# Patient Record
Sex: Male | Born: 1960 | Race: White | Hispanic: No | Marital: Married | Smoking: Former smoker
Health system: Southern US, Community
[De-identification: ages and names within clinical notes are randomized; demographics above are authoritative.]

## PROBLEM LIST (undated history)

## (undated) DIAGNOSIS — M1990 Unspecified osteoarthritis, unspecified site: Secondary | ICD-10-CM

## (undated) DIAGNOSIS — G459 Transient cerebral ischemic attack, unspecified: Secondary | ICD-10-CM

## (undated) DIAGNOSIS — I10 Essential (primary) hypertension: Secondary | ICD-10-CM

## (undated) DIAGNOSIS — E7800 Pure hypercholesterolemia, unspecified: Secondary | ICD-10-CM

## (undated) DIAGNOSIS — C801 Malignant (primary) neoplasm, unspecified: Secondary | ICD-10-CM

## (undated) HISTORY — DX: Transient cerebral ischemic attack, unspecified: G45.9

## (undated) HISTORY — DX: Essential (primary) hypertension: I10

## (undated) HISTORY — DX: Unspecified osteoarthritis, unspecified site: M19.90

## (undated) HISTORY — PX: MELANOMA EXCISION: SHX5266

## (undated) HISTORY — DX: Malignant (primary) neoplasm, unspecified: C80.1

---

## 1998-04-08 HISTORY — PX: LUMBAR MICRODISCECTOMY: SHX99

## 2003-04-09 HISTORY — PX: CERVICAL FUSION: SHX112

## 2003-10-13 ENCOUNTER — Ambulatory Visit (HOSPITAL_COMMUNITY): Admission: RE | Admit: 2003-10-13 | Discharge: 2003-10-14 | Payer: Self-pay | Admitting: Neurological Surgery

## 2003-11-28 ENCOUNTER — Encounter: Admission: RE | Admit: 2003-11-28 | Discharge: 2003-11-28 | Payer: Self-pay | Admitting: Anesthesiology

## 2007-08-24 ENCOUNTER — Emergency Department: Payer: Self-pay | Admitting: Emergency Medicine

## 2007-09-01 ENCOUNTER — Ambulatory Visit: Payer: Self-pay | Admitting: Orthopedic Surgery

## 2009-05-01 ENCOUNTER — Ambulatory Visit: Payer: Self-pay | Admitting: Internal Medicine

## 2010-02-01 ENCOUNTER — Ambulatory Visit: Payer: Self-pay | Admitting: Internal Medicine

## 2011-09-11 ENCOUNTER — Ambulatory Visit: Payer: Self-pay | Admitting: Family Medicine

## 2012-04-08 HISTORY — PX: SHOULDER SURGERY: SHX246

## 2015-05-23 ENCOUNTER — Encounter: Payer: Self-pay | Admitting: Family Medicine

## 2015-05-23 ENCOUNTER — Ambulatory Visit (INDEPENDENT_AMBULATORY_CARE_PROVIDER_SITE_OTHER): Payer: 59 | Admitting: Family Medicine

## 2015-05-23 VITALS — BP 138/78 | HR 86 | Temp 98.2°F | Resp 18 | Ht 71.0 in | Wt 236.6 lb

## 2015-05-23 DIAGNOSIS — Z7689 Persons encountering health services in other specified circumstances: Secondary | ICD-10-CM

## 2015-05-23 DIAGNOSIS — Z7189 Other specified counseling: Secondary | ICD-10-CM | POA: Diagnosis not present

## 2015-05-23 NOTE — Progress Notes (Signed)
Name: Douglas Friedman   MRN: [id number]    DOB: [birth year]   Date:05/23/2015       Progress Note  Subjective  Chief Complaint  Chief Complaint  Patient presents with  . Establish Care    NP    HPI  Pt. Here to establish care. No previous PCP.  Past Medical History  Diagnosis Date  . Arthritis     Past Surgical History  Procedure Laterality Date  . Cervical fusion  2005  . Shoulder surgery Right 2014    Torn Supraspinatus  . Lumbar microdiscectomy Bilateral 2000    L4 and L5    Family History  Problem Relation Age of Onset  . Diabetes Mother   . Diabetes Brother     Social History   Social History  . Marital Status: Single    Spouse Name: N/A  . Number of Children: N/A  . Years of Education: N/A   Occupational History  . Not on file.   Social History Main Topics  . Smoking status: Never Smoker   . Smokeless tobacco: Never Used  . Alcohol Use: 3.6 oz/week    0 Standard drinks or equivalent, 6 Cans of beer per week     Comment: occasional,   . Drug Use: No  . Sexual Activity:    Partners: Female   Other Topics Concern  . Not on file   Social History Narrative  . No narrative on file    No current outpatient prescriptions on file.  No Known Allergies   ROS    Objective  Filed Vitals:   05/23/15 0952  BP: 138/78  Pulse: 86  Temp: 98.2 F (36.8 C)  TempSrc: Oral  Resp: 18  Height: 5\' 11"  (1.803 m)  Weight: 236 lb 9.6 oz (107.321 kg)  SpO2: 97%    Physical Exam  Constitutional: He is oriented to person, place, and time and well-developed, well-nourished, and in no distress.  Cardiovascular: Normal rate and regular rhythm.   Pulmonary/Chest: Effort normal and breath sounds normal.  Neurological: He is alert and oriented to person, place, and time.  Nursing note and vitals reviewed.    Assessment & Plan  1. Encounter to establish care with new doctor No previous PCP. Past medical, surgical, social and family history reviewed.  Will schedule for complete physical exam in 4 weeks.   Douglas Friedman [facility] 05/23/2015 10:14 AM

## 2015-06-20 ENCOUNTER — Encounter: Payer: Self-pay | Admitting: Family Medicine

## 2015-06-20 ENCOUNTER — Ambulatory Visit (INDEPENDENT_AMBULATORY_CARE_PROVIDER_SITE_OTHER): Payer: 59 | Admitting: Family Medicine

## 2015-06-20 VITALS — BP 134/80 | HR 83 | Temp 98.6°F | Resp 15 | Ht 71.0 in | Wt 237.3 lb

## 2015-06-20 DIAGNOSIS — Z1211 Encounter for screening for malignant neoplasm of colon: Secondary | ICD-10-CM | POA: Insufficient documentation

## 2015-06-20 DIAGNOSIS — Z0000 Encounter for general adult medical examination without abnormal findings: Secondary | ICD-10-CM | POA: Insufficient documentation

## 2015-06-20 NOTE — Progress Notes (Signed)
Name: Douglas Friedman   MRN: [id number]    DOB: Mar 18, 1961   Date:06/20/2015       Progress Note  Subjective  Chief Complaint  Chief Complaint  Patient presents with  . Annual Exam    CPE    HPI  Complete Physical Exam  Pt. Is here for a Complete Physical Exam. Never had colon or prostate cancer screening. Never had EKG or lipid screening. He is in good health otherwise.  Past Medical History  Diagnosis Date  . Arthritis     Past Surgical History  Procedure Laterality Date  . Cervical fusion  2005  . Shoulder surgery Right 2014    Torn Supraspinatus  . Lumbar microdiscectomy Bilateral 2000    L4 and L5    Family History  Problem Relation Age of Onset  . Diabetes Mother   . Diabetes Brother     Social History   Social History  . Marital Status: Single    Spouse Name: N/A  . Number of Children: N/A  . Years of Education: N/A   Occupational History  . Not on file.   Social History Main Topics  . Smoking status: Never Smoker   . Smokeless tobacco: Never Used  . Alcohol Use: 3.6 oz/week    0 Standard drinks or equivalent, 6 Cans of beer per week     Comment: occasional,   . Drug Use: No  . Sexual Activity:    Partners: Female   Other Topics Concern  . Not on file   Social History Narrative    No current outpatient prescriptions on file.  No Known Allergies   Review of Systems  Constitutional: Negative for fever, chills, weight loss and malaise/fatigue.  HENT: Negative for congestion and sore throat.   Eyes: Negative for blurred vision and double vision.  Respiratory: Negative for cough and shortness of breath.   Cardiovascular: Negative for chest pain.  Gastrointestinal: Negative for heartburn, nausea, vomiting, blood in stool and melena.  Genitourinary: Negative for dysuria, frequency and hematuria.  Musculoskeletal: Positive for back pain (back stays sore occasionally, had surgery years ago.). Negative for joint pain.  Neurological:  Negative for dizziness and headaches.  Psychiatric/Behavioral: Negative for depression. The patient is not nervous/anxious and does not have insomnia.       Objective  Filed Vitals:   06/20/15 0916  BP: 134/80  Pulse: 83  Temp: 98.6 F (37 C)  TempSrc: Oral  Resp: 15  Height: 5\' 11"  (1.803 m)  Weight: 237 lb 4.8 oz (107.639 kg)  SpO2: 96%    Physical Exam  Constitutional: He is oriented to person, place, and time and well-developed, well-nourished, and in no distress.  HENT:  Head: Normocephalic and atraumatic.  Eyes: Pupils are equal, round, and reactive to light.  Cardiovascular: Normal rate and regular rhythm.   No murmur heard. Pulmonary/Chest: Effort normal and breath sounds normal. He has no wheezes.  Abdominal: Soft. Bowel sounds are normal. There is no tenderness.  Genitourinary: Rectum normal and prostate normal. Prostate is not tender.  Peri-rectal skin tag  Musculoskeletal:       Right ankle: He exhibits no swelling.       Left ankle: He exhibits no swelling.  Neurological: He is alert and oriented to person, place, and time.  Skin: Skin is warm and dry.  Psychiatric: Memory, affect and judgment normal.  Nursing note and vitals reviewed.      Assessment & Plan  1. Annual physical exam  Obtain  age-appropriate screenings including lab work. EKG is normal sinus rhythm.  - Lipid Profile - Comprehensive Metabolic Panel (CMET) - TSH - Vitamin D (25 hydroxy) - PSA - EKG 12-Lead  2. Screening for colon cancer Overdue for colon cancer screening. - Ambulatory referral to Gastroenterology   Dossie Der [facility] 06/20/2015 9:43 AM

## 2015-06-21 LAB — LIPID PANEL
CHOL/HDL RATIO: 5 ratio (ref 0.0–5.0)
Cholesterol, Total: 203 mg/dL — ABNORMAL HIGH (ref 100–199)
HDL: 41 mg/dL (ref >39)
LDL CALC: 134 mg/dL — AB (ref 0–99)
Triglycerides: 138 mg/dL (ref 0–149)
VLDL CHOLESTEROL CAL: 28 mg/dL (ref 5–40)

## 2015-06-21 LAB — COMPREHENSIVE METABOLIC PANEL
ALK PHOS: 58 IU/L (ref 39–117)
ALT: 27 IU/L (ref 0–44)
AST: 16 IU/L (ref 0–40)
Albumin/Globulin Ratio: 1.6 (ref 1.2–2.2)
Albumin: 4.3 g/dL (ref 3.5–5.5)
BILIRUBIN TOTAL: 0.3 mg/dL (ref 0.0–1.2)
BUN / CREAT RATIO: 13 (ref 9–20)
BUN: 15 mg/dL (ref 6–24)
CALCIUM: 10.1 mg/dL (ref 8.7–10.2)
CHLORIDE: 103 mmol/L (ref 96–106)
CO2: 23 mmol/L (ref 18–29)
CREATININE: 1.15 mg/dL (ref 0.76–1.27)
GFR calc Af Amer: 83 mL/min/{1.73_m2} (ref >59)
GFR calc non Af Amer: 72 mL/min/{1.73_m2} (ref >59)
GLOBULIN, TOTAL: 2.7 g/dL (ref 1.5–4.5)
GLUCOSE: 101 mg/dL — AB (ref 65–99)
POTASSIUM: 4.8 mmol/L (ref 3.5–5.2)
SODIUM: 141 mmol/L (ref 134–144)
Total Protein: 7 g/dL (ref 6.0–8.5)

## 2015-06-21 LAB — TSH: TSH: 2.8 u[IU]/mL (ref 0.450–4.500)

## 2015-06-21 LAB — VITAMIN D 25 HYDROXY (VIT D DEFICIENCY, FRACTURES): VIT D 25 HYDROXY: 30.6 ng/mL (ref 30.0–100.0)

## 2015-06-21 LAB — PSA: Prostate Specific Ag, Serum: 0.8 ng/mL (ref 0.0–4.0)

## 2015-06-22 ENCOUNTER — Telehealth: Payer: Self-pay | Admitting: Gastroenterology

## 2015-06-22 NOTE — Telephone Encounter (Signed)
Patient was returning your phone call regarding scheduling a colonoscopy

## 2015-06-23 ENCOUNTER — Other Ambulatory Visit: Payer: Self-pay

## 2015-06-23 NOTE — Telephone Encounter (Signed)
Gastroenterology Pre-Procedure Review  Request Date: 07/10/15 Requesting Physician: Dr. Manuella Ghazi  PATIENT REVIEW QUESTIONS: The patient responded to the following health history questions as indicated:    1. Are you having any GI issues? no 2. Do you have a personal history of Polyps? no 3. Do you have a family history of Colon Cancer or Polyps? no 4. Diabetes Mellitus? no 5. Joint replacements in the past 12 months?no 6. Major health problems in the past 3 months?no 7. Any artificial heart valves, MVP, or defibrillator?no    MEDICATIONS & ALLERGIES:    Patient reports the following regarding taking any anticoagulation/antiplatelet therapy:   Plavix, Coumadin, Eliquis, Xarelto, Lovenox, Pradaxa, Brilinta, or Effient? no Aspirin? no  Patient confirms/reports the following medications:  No current outpatient prescriptions on file.   No current facility-administered medications for this visit.    Patient confirms/reports the following allergies:  No Known Allergies  No orders of the defined types were placed in this encounter.    AUTHORIZATION INFORMATION Primary Insurance: 1D#: Group #:  Secondary Insurance: 1D#: Group #:  SCHEDULE INFORMATION: Date: 07/10/15 Time: Location: [place]

## 2015-06-23 NOTE — Telephone Encounter (Signed)
Pt scheduled for screening colonoscopy at [facility] on 07/10/15. Instructs/rx mailed. Please precert.

## 2015-06-23 NOTE — Telephone Encounter (Signed)
Pt. Returned your call. 

## 2015-06-29 ENCOUNTER — Encounter: Payer: Self-pay | Admitting: Family Medicine

## 2015-06-29 ENCOUNTER — Ambulatory Visit (INDEPENDENT_AMBULATORY_CARE_PROVIDER_SITE_OTHER): Payer: 59 | Admitting: Family Medicine

## 2015-06-29 VITALS — BP 122/80 | HR 102 | Temp 97.8°F | Resp 18 | Ht 71.0 in | Wt 232.5 lb

## 2015-06-29 DIAGNOSIS — E785 Hyperlipidemia, unspecified: Secondary | ICD-10-CM | POA: Diagnosis not present

## 2015-06-29 MED ORDER — ROSUVASTATIN CALCIUM 5 MG PO TABS: 5.0000 mg | ORAL_TABLET | Freq: Every day | ORAL | Status: DC

## 2015-06-29 NOTE — Progress Notes (Signed)
Name: Douglas Friedman   MRN: [id number]    DOB: 1960/05/03   Date:06/29/2015       Progress Note  Subjective  Chief Complaint  Chief Complaint  Patient presents with  . review lab results    HPI  Hyperlipidemia: Elevated Total Cholesterol and LDL. 10-year risk of CVD is estimated at 10%. Pt. eats a balanced low-cholesterol diet. Is going to start walking again. No FHx of high cholesterol.  Past Medical History  Diagnosis Date  . Arthritis     Past Surgical History  Procedure Laterality Date  . Cervical fusion  2005  . Shoulder surgery Right 2014    Torn Supraspinatus  . Lumbar microdiscectomy Bilateral 2000    L4 and L5    Family History  Problem Relation Age of Onset  . Diabetes Mother   . Diabetes Brother     Social History   Social History  . Marital Status: Single    Spouse Name: N/A  . Number of Children: N/A  . Years of Education: N/A   Occupational History  . Not on file.   Social History Main Topics  . Smoking status: Never Smoker   . Smokeless tobacco: Never Used  . Alcohol Use: 3.6 oz/week    0 Standard drinks or equivalent, 6 Cans of beer per week     Comment: occasional,   . Drug Use: No  . Sexual Activity:    Partners: Female   Other Topics Concern  . Not on file   Social History Narrative     Current outpatient prescriptions:  .  cyclobenzaprine (FLEXERIL) 10 MG tablet, Take by mouth., Disp: , Rfl:  .  ibuprofen (ADVIL,MOTRIN) 600 MG tablet, Take by mouth., Disp: , Rfl:   No Known Allergies   ROS    Objective  Filed Vitals:   06/29/15 1349  BP: 122/80  Pulse: 102  Temp: 97.8 F (36.6 C)  Resp: 18  Height: 5\' 11"  (1.803 m)  Weight: 232 lb 8 oz (105.461 kg)  SpO2: 97%    Physical Exam  Constitutional: He is oriented to person, place, and time and well-developed, well-nourished, and in no distress.  Cardiovascular: Normal rate and regular rhythm.   Pulmonary/Chest: Effort normal and breath sounds normal.   Neurological: He is alert and oriented to person, place, and time.  Nursing note and vitals reviewed.      Assessment & Plan  1. Hyperlipidemia We will start on Crestor 5 mg at bedtime. Patient educated on potential adverse effects. Continue with dietary and lifestyle interventions. Recheck in 3 months - rosuvastatin (CRESTOR) 5 MG tablet; Take 1 tablet (5 mg total) by mouth at bedtime.  Dispense: 90 tablet; Refill: 0   Douglas Friedman [facility] 06/29/2015 2:41 PM

## 2015-07-03 ENCOUNTER — Ambulatory Visit: Payer: 59 | Admitting: Family Medicine

## 2015-07-05 ENCOUNTER — Encounter: Payer: Self-pay | Admitting: *Deleted

## 2015-07-06 NOTE — Discharge Instructions (Signed)

## 2015-07-10 ENCOUNTER — Ambulatory Visit
Admission: RE | Admit: 2015-07-10 | Discharge: 2015-07-10 | Disposition: A | Discharge status: Home / Self Care | Payer: 59 | Source: Ambulatory Visit | Attending: Gastroenterology | Admitting: Gastroenterology

## 2015-07-10 ENCOUNTER — Ambulatory Visit: Payer: 59 | Admitting: Anesthesiology

## 2015-07-10 ENCOUNTER — Encounter
Admission: RE | Disposition: A | Discharge status: Home / Self Care | Payer: Self-pay | Source: Ambulatory Visit | Attending: Gastroenterology

## 2015-07-10 DIAGNOSIS — Z1211 Encounter for screening for malignant neoplasm of colon: Secondary | ICD-10-CM | POA: Insufficient documentation

## 2015-07-10 DIAGNOSIS — Z87891 Personal history of nicotine dependence: Secondary | ICD-10-CM | POA: Insufficient documentation

## 2015-07-10 DIAGNOSIS — Z79899 Other long term (current) drug therapy: Secondary | ICD-10-CM | POA: Diagnosis not present

## 2015-07-10 DIAGNOSIS — K641 Second degree hemorrhoids: Secondary | ICD-10-CM | POA: Diagnosis not present

## 2015-07-10 DIAGNOSIS — D122 Benign neoplasm of ascending colon: Secondary | ICD-10-CM | POA: Diagnosis not present

## 2015-07-10 DIAGNOSIS — M1990 Unspecified osteoarthritis, unspecified site: Secondary | ICD-10-CM | POA: Insufficient documentation

## 2015-07-10 DIAGNOSIS — K5730 Diverticulosis of large intestine without perforation or abscess without bleeding: Secondary | ICD-10-CM | POA: Insufficient documentation

## 2015-07-10 DIAGNOSIS — E7800 Pure hypercholesterolemia, unspecified: Secondary | ICD-10-CM | POA: Insufficient documentation

## 2015-07-10 HISTORY — DX: Pure hypercholesterolemia, unspecified: E78.00

## 2015-07-10 HISTORY — PX: POLYPECTOMY: SHX5525

## 2015-07-10 HISTORY — PX: COLONOSCOPY WITH PROPOFOL: SHX5780

## 2015-07-10 SURGERY — COLONOSCOPY WITH PROPOFOL
Anesthesia: Monitor Anesthesia Care | Wound class: Contaminated

## 2015-07-10 MED ORDER — PROPOFOL 10 MG/ML IV BOLUS
INTRAVENOUS | Status: DC | PRN
  Administered 2015-07-10: 100 mg via INTRAVENOUS
  Administered 2015-07-10: 30 mg via INTRAVENOUS
  Administered 2015-07-10: 20 mg via INTRAVENOUS
  Administered 2015-07-10 (×2): 50 mg via INTRAVENOUS

## 2015-07-10 MED ORDER — LACTATED RINGERS IV SOLN
INTRAVENOUS | Status: DC
  Administered 2015-07-10 (×2): via INTRAVENOUS

## 2015-07-10 MED ORDER — LIDOCAINE HCL (CARDIAC) 20 MG/ML IV SOLN
INTRAVENOUS | Status: DC | PRN
  Administered 2015-07-10: 20 mg via INTRAVENOUS

## 2015-07-10 MED ORDER — STERILE WATER FOR IRRIGATION IR SOLN
Status: DC | PRN
  Administered 2015-07-10: 11:00:00

## 2015-07-10 SURGICAL SUPPLY — 21 items
CANISTER SUCT 1200ML W/VALVE (MISCELLANEOUS) ×4 IMPLANT
CLIP HMST 235XBRD CATH ROT (MISCELLANEOUS) IMPLANT
CLIP RESOLUTION 360 11X235 (MISCELLANEOUS)
FCP ESCP3.2XJMB 240X2.8X (MISCELLANEOUS)
FORCEPS BIOP RAD 4 LRG CAP 4 (CUTTING FORCEPS) ×2 IMPLANT
FORCEPS BIOP RJ4 240 W/NDL (MISCELLANEOUS)
FORCEPS ESCP3.2XJMB 240X2.8X (MISCELLANEOUS) IMPLANT
GOWN CVR UNV OPN BCK APRN NK (MISCELLANEOUS) ×4 IMPLANT
GOWN ISOL THUMB LOOP REG UNIV (MISCELLANEOUS) ×8
INJECTOR VARIJECT VIN23 (MISCELLANEOUS) IMPLANT
KIT DEFENDO VALVE AND CONN (KITS) IMPLANT
KIT ENDO PROCEDURE OLY (KITS) ×4 IMPLANT
MARKER SPOT ENDO TATTOO 5ML (MISCELLANEOUS) IMPLANT
PAD GROUND ADULT SPLIT (MISCELLANEOUS) IMPLANT
PROBE APC STR FIRE (PROBE) ×4 IMPLANT
SNARE SHORT THROW 13M SML OVAL (MISCELLANEOUS) IMPLANT
SNARE SHORT THROW 30M LRG OVAL (MISCELLANEOUS) IMPLANT
SPOT EX ENDOSCOPIC TATTOO (MISCELLANEOUS)
VARIJECT INJECTOR VIN23 (MISCELLANEOUS)
WATER STERILE IRR 250ML POUR (IV SOLUTION) ×4 IMPLANT
WIDE-EYE POLYPTRAP (MISCELLANEOUS) IMPLANT

## 2015-07-10 NOTE — H&P (Signed)
  [facility]  [address], [place], [place] [address] Phone: [phone] Fax : [phone]  Primary Care Physician:  Keith Rake, MD Primary Gastroenterologist:  Dr. Allen Norris  Pre-Procedure History & Physical: HPI:  Douglas Friedman is a 55 y.o. male is here for a screening colonoscopy.   Past Medical History  Diagnosis Date  . Arthritis   . Hypercholesteremia     Past Surgical History  Procedure Laterality Date  . Cervical fusion  2005    x2  . Shoulder surgery Right 2014    Torn Supraspinatus  . Lumbar microdiscectomy Bilateral 2000    L4 and L5    Prior to Admission medications   Medication Sig Start Date End Date Taking? Authorizing Provider  Cholecalciferol (VITAMIN D PO) Take by mouth daily.   Yes Historical Provider, MD  Multiple Vitamin (MULTIVITAMIN) capsule Take 1 capsule by mouth daily.   Yes Historical Provider, MD  rosuvastatin (CRESTOR) 5 MG tablet Take 1 tablet (5 mg total) by mouth at bedtime. 06/29/15  Yes Roselee Nova, MD    Allergies as of 06/23/2015  . (No Known Allergies)    Family History  Problem Relation Age of Onset  . Diabetes Mother   . Diabetes Brother     Social History   Social History  . Marital Status: Single    Spouse Name: N/A  . Number of Children: N/A  . Years of Education: N/A   Occupational History  . Not on file.   Social History Main Topics  . Smoking status: Former Research scientist (life sciences)  . Smokeless tobacco: Never Used     Comment: quit 1988  . Alcohol Use: 3.6 oz/week    6 Cans of beer, 0 Standard drinks or equivalent per week     Comment: occasional,   . Drug Use: No  . Sexual Activity:    Partners: Female   Other Topics Concern  . Not on file   Social History Narrative    Review of Systems: See HPI, otherwise negative ROS  Physical Exam: BP 128/99 mmHg  Pulse 74  Temp(Src) 98.6 F (37 C) (Tympanic)  Resp 16  Ht 5\' 11"  (1.803 m)  Wt 225 lb (102.059 kg)  BMI 31.39 kg/m2  SpO2  96% General:   Alert,  pleasant and cooperative in NAD Head:  Normocephalic and atraumatic. Neck:  Supple; no masses or thyromegaly. Lungs:  Clear throughout to auscultation.    Heart:  Regular rate and rhythm. Abdomen:  Soft, nontender and nondistended. Normal bowel sounds, without guarding, and without rebound.   Neurologic:  Alert and  oriented x4;  grossly normal neurologically.  Impression/Plan: Douglas Friedman is now here to undergo a screening colonoscopy.  Risks, benefits, and alternatives regarding colonoscopy have been reviewed with the patient.  Questions have been answered.  All parties agreeable.

## 2015-07-10 NOTE — Anesthesia Postprocedure Evaluation (Signed)
Anesthesia Post Note  Patient: Douglas Friedman  Procedure(s) Performed: Procedure(s) (LRB): COLONOSCOPY WITH PROPOFOL (N/A) POLYPECTOMY  Patient location during evaluation: PACU Anesthesia Type: MAC Level of consciousness: awake and alert Pain management: pain level controlled Vital Signs Assessment: post-procedure vital signs reviewed and stable Respiratory status: spontaneous breathing, nonlabored ventilation, respiratory function stable and patient connected to nasal cannula oxygen Cardiovascular status: blood pressure returned to baseline and stable Postop Assessment: no signs of nausea or vomiting Anesthetic complications: no    Alisa Graff

## 2015-07-10 NOTE — Anesthesia Preprocedure Evaluation (Signed)
Anesthesia Evaluation  Patient identified by MRN, date of birth, ID band Patient awake    Reviewed: Allergy & Precautions, H&P , NPO status , Patient's Chart, lab work & pertinent test results, reviewed documented beta blocker date and time   Airway Mallampati: II  TM Distance: >3 FB Neck ROM: full    Dental no notable dental hx.    Pulmonary neg pulmonary ROS, former smoker,    Pulmonary exam normal breath sounds clear to auscultation       Cardiovascular Exercise Tolerance: Good negative cardio ROS   Rhythm:regular Rate:Normal     Neuro/Psych negative neurological ROS  negative psych ROS   GI/Hepatic negative GI ROS, Neg liver ROS,   Endo/Other  negative endocrine ROS  Renal/GU negative Renal ROS  negative genitourinary   Musculoskeletal   Abdominal   Peds  Hematology negative hematology ROS (+)   Anesthesia Other Findings   Reproductive/Obstetrics negative OB ROS                             Anesthesia Physical Anesthesia Plan  ASA: II  Anesthesia Plan: MAC   Post-op Pain Management:    Induction:   Airway Management Planned:   Additional Equipment:   Intra-op Plan:   Post-operative Plan:   Informed Consent: I have reviewed the patients History and Physical, chart, labs and discussed the procedure including the risks, benefits and alternatives for the proposed anesthesia with the patient or authorized representative who has indicated his/her understanding and acceptance.   Dental Advisory Given  Plan Discussed with: CRNA  Anesthesia Plan Comments:         Anesthesia Quick Evaluation

## 2015-07-10 NOTE — Op Note (Signed)
[facility] Patient Name: Douglas Friedman Procedure Date: 07/10/2015 10:36 AM MRN: [id number] Account #: [id number] Date of Birth: September 15, 1960 Admit Type: Outpatient Age: 55 Room: [facility] OR ROOM 01 Gender: Male Note Status: Finalized Procedure:            Colonoscopy Indications:          Screening for colorectal malignant neoplasm Providers:            Lucilla Lame, MD Referring MD:         Otila Back. Manuella Ghazi (Referring MD) Medicines:            Propofol per Anesthesia Complications:        No immediate complications. Procedure:            Pre-Anesthesia Assessment:                       - Prior to the procedure, a History and Physical was                        performed, and patient medications and allergies were                        reviewed. The patient's tolerance of previous                        anesthesia was also reviewed. The risks and benefits of                        the procedure and the sedation options and risks were                        discussed with the patient. All questions were                        answered, and informed consent was obtained. Prior                        Anticoagulants: The patient has taken no previous                        anticoagulant or antiplatelet agents. ASA Grade                        Assessment: II - A patient with mild systemic disease.                        After reviewing the risks and benefits, the patient was                        deemed in satisfactory condition to undergo the                        procedure.                       After obtaining informed consent, the colonoscope was                        passed under direct vision. Throughout the procedure,  the patient's blood pressure, pulse, and oxygen                        saturations were monitored continuously. The was                        introduced through the anus and advanced to the the      cecum, identified by appendiceal orifice and ileocecal                        valve. The colonoscopy was performed without                        difficulty. The patient tolerated the procedure well.                        The quality of the bowel preparation was excellent. Findings:      The perianal and digital rectal examinations were normal.      A 5 mm polyp was found in the ascending colon. The polyp was sessile.       The polyp was removed with a cold biopsy forceps. Resection and       retrieval were complete.      Non-bleeding internal hemorrhoids were found during retroflexion. The       hemorrhoids were Grade II (internal hemorrhoids that prolapse but reduce       spontaneously).      Multiple small-mouthed diverticula were found in the sigmoid colon. Impression:           - One 5 mm polyp in the ascending colon, removed with a                        cold biopsy forceps. Resected and retrieved.                       - Non-bleeding internal hemorrhoids.                       - Diverticulosis in the sigmoid colon. Recommendation:       - Await pathology results.                       - Repeat colonoscopy in 5 years if polyp adenoma and 10                        years if hyperplastic Procedure Code(s):    --- Professional ---                       [id number], Colonoscopy, flexible; with biopsy, single or                        multiple Diagnosis Code(s):    --- Professional ---                       Z12.11, Encounter for screening for malignant neoplasm                        of colon  D12.2, Benign neoplasm of ascending colon CPT copyright 2016 American Medical Association. All rights reserved. The codes documented in this report are preliminary and upon coder review may  be revised to meet current compliance requirements. Lucilla Lame, MD 07/10/2015 11:01:57 AM This report has been signed electronically. Number of Addenda: 0 Note Initiated On: 07/10/2015  10:36 AM Scope Withdrawal Time: 0 hours 6 minutes 18 seconds  Total Procedure Duration: 0 hours 8 minutes 9 seconds       [facility]

## 2015-07-10 NOTE — Anesthesia Procedure Notes (Signed)
Procedure Name: MAC Performed by: Alis Sawchuk Pre-anesthesia Checklist: Patient identified, Emergency Drugs available, Suction available, Patient being monitored and Timeout performed Patient Re-evaluated:Patient Re-evaluated prior to inductionOxygen Delivery Method: Nasal cannula       

## 2015-07-10 NOTE — Transfer of Care (Signed)
Immediate Anesthesia Transfer of Care Note  Patient: Douglas Friedman  Procedure(s) Performed: Procedure(s): COLONOSCOPY WITH PROPOFOL (N/A) POLYPECTOMY  Patient Location: PACU  Anesthesia Type: MAC  Level of Consciousness: awake, alert  and patient cooperative  Airway and Oxygen Therapy: Patient Spontanous Breathing and Patient connected to supplemental oxygen  Post-op Assessment: Post-op Vital signs reviewed, Patient's Cardiovascular Status Stable, Respiratory Function Stable, Patent Airway and No signs of Nausea or vomiting  Post-op Vital Signs: Reviewed and stable  Complications: No apparent anesthesia complications

## 2015-07-11 ENCOUNTER — Encounter: Payer: Self-pay | Admitting: Gastroenterology

## 2015-07-12 ENCOUNTER — Encounter: Payer: Self-pay | Admitting: Gastroenterology

## 2015-09-29 ENCOUNTER — Ambulatory Visit: Payer: 59 | Admitting: Family Medicine

## 2015-10-16 ENCOUNTER — Encounter: Payer: Self-pay | Admitting: Family Medicine

## 2015-10-16 ENCOUNTER — Ambulatory Visit (INDEPENDENT_AMBULATORY_CARE_PROVIDER_SITE_OTHER): Payer: 59 | Admitting: Family Medicine

## 2015-10-16 VITALS — BP 125/81 | HR 79 | Temp 98.4°F | Resp 15 | Ht 71.0 in | Wt 232.9 lb

## 2015-10-16 DIAGNOSIS — R739 Hyperglycemia, unspecified: Secondary | ICD-10-CM | POA: Insufficient documentation

## 2015-10-16 DIAGNOSIS — E785 Hyperlipidemia, unspecified: Secondary | ICD-10-CM | POA: Diagnosis not present

## 2015-10-16 LAB — COMPREHENSIVE METABOLIC PANEL
ALBUMIN: 4.2 g/dL (ref 3.6–5.1)
ALT: 24 U/L (ref 9–46)
AST: 20 U/L (ref 10–35)
Alkaline Phosphatase: 52 U/L (ref 40–115)
BILIRUBIN TOTAL: 0.4 mg/dL (ref 0.2–1.2)
BUN: 15 mg/dL (ref 7–25)
CHLORIDE: 106 mmol/L (ref 98–110)
CO2: 24 mmol/L (ref 20–31)
CREATININE: 1.02 mg/dL (ref 0.70–1.33)
Calcium: 9.4 mg/dL (ref 8.6–10.3)
GLUCOSE: 99 mg/dL (ref 65–99)
Potassium: 4.6 mmol/L (ref 3.5–5.3)
SODIUM: 139 mmol/L (ref 135–146)
Total Protein: 6.9 g/dL (ref 6.1–8.1)

## 2015-10-16 LAB — LIPID PANEL
CHOL/HDL RATIO: 4 ratio (ref <=5.0)
Cholesterol: 175 mg/dL (ref 125–200)
HDL: 44 mg/dL (ref >=40)
LDL CALC: 108 mg/dL (ref <130)
Triglycerides: 113 mg/dL (ref <150)
VLDL: 23 mg/dL (ref <30)

## 2015-10-16 LAB — POCT GLYCOSYLATED HEMOGLOBIN (HGB A1C): Hemoglobin A1C: 5.7

## 2015-10-16 NOTE — Progress Notes (Signed)
Name: Douglas Friedman   MRN: [id number]    DOB: [birth year]   Date:10/16/2015       Progress Note  Subjective  Chief Complaint  Chief Complaint  Patient presents with  . Follow-up    3 mo    Hyperlipidemia This is a new problem. The current episode started more than 1 month ago. The problem is uncontrolled. Recent lipid tests were reviewed and are high. Pertinent negatives include no myalgias or shortness of breath. Current antihyperlipidemic treatment includes statins. Compliance problems: SOmetimes forgets to take it.  Risk factors for coronary artery disease include dyslipidemia.     Past Medical History  Diagnosis Date  . Arthritis   . Hypercholesteremia     Past Surgical History  Procedure Laterality Date  . Cervical fusion  2005    x2  . Shoulder surgery Right 2014    Torn Supraspinatus  . Lumbar microdiscectomy Bilateral 2000    L4 and L5  . Colonoscopy with propofol N/A 07/10/2015    Procedure: COLONOSCOPY WITH PROPOFOL;  Surgeon: Lucilla Lame, MD;  Location: [place];  Service: Endoscopy;  Laterality: N/A;  . Polypectomy  07/10/2015    Procedure: POLYPECTOMY;  Surgeon: Lucilla Lame, MD;  Location: [place];  Service: Endoscopy;;    Family History  Problem Relation Age of Onset  . Diabetes Mother   . Diabetes Brother     Social History   Social History  . Marital Status: Single    Spouse Name: N/A  . Number of Children: N/A  . Years of Education: N/A   Occupational History  . Not on file.   Social History Main Topics  . Smoking status: Former Research scientist (life sciences)  . Smokeless tobacco: Never Used     Comment: quit 1988  . Alcohol Use: 3.6 oz/week    6 Cans of beer, 0 Standard drinks or equivalent per week     Comment: occasional,   . Drug Use: No  . Sexual Activity:    Partners: Female   Other Topics Concern  . Not on file   Social History Narrative     Current outpatient prescriptions:  .  Cholecalciferol (VITAMIN D PO), Take by mouth  daily., Disp: , Rfl:  .  Multiple Vitamin (MULTIVITAMIN) capsule, Take 1 capsule by mouth daily., Disp: , Rfl:  .  rosuvastatin (CRESTOR) 5 MG tablet, Take 1 tablet (5 mg total) by mouth at bedtime., Disp: 90 tablet, Rfl: 0  No Known Allergies   Review of Systems  Respiratory: Negative for shortness of breath.   Musculoskeletal: Negative for myalgias.    Objective  Filed Vitals:   10/16/15 0928  BP: 125/81  Pulse: 79  Temp: 98.4 F (36.9 C)  TempSrc: Oral  Resp: 15  Height: 5\' 11"  (1.803 m)  Weight: 232 lb 14.4 oz (105.643 kg)  SpO2: 95%    Physical Exam  Constitutional: He is oriented to person, place, and time and well-developed, well-nourished, and in no distress.  Cardiovascular: Normal rate, regular rhythm and normal heart sounds.   No murmur heard. Pulmonary/Chest: Effort normal and breath sounds normal.  Abdominal: Soft. Bowel sounds are normal. There is no tenderness.  Neurological: He is alert and oriented to person, place, and time.  Skin: Skin is warm and dry.  Psychiatric: Mood, memory, affect and judgment normal.  Nursing note and vitals reviewed.    Assessment & Plan  1. Hyperlipidemia Has not taken statin regularly, repeat lipids and CMP today, encourage medication compliance.  Recheck in 6 months - Lipid Profile - Comprehensive Metabolic Panel (CMET)  2. Hyperglycemia  - POCT HgB A1C   Simuel Stebner [facility] 10/16/2015 9:31 AM

## 2015-10-18 ENCOUNTER — Telehealth: Payer: Self-pay | Admitting: Emergency Medicine

## 2015-10-18 NOTE — Telephone Encounter (Signed)
patient notified of labs

## 2015-10-24 ENCOUNTER — Other Ambulatory Visit: Payer: Self-pay | Admitting: Emergency Medicine

## 2015-10-24 DIAGNOSIS — E785 Hyperlipidemia, unspecified: Secondary | ICD-10-CM

## 2015-10-24 MED ORDER — ROSUVASTATIN CALCIUM 5 MG PO TABS: 5.0000 mg | ORAL_TABLET | Freq: Every day | ORAL | Status: DC

## 2016-04-17 ENCOUNTER — Ambulatory Visit: Payer: 59 | Admitting: Family Medicine

## 2018-02-24 ENCOUNTER — Ambulatory Visit
Admission: EM | Admit: 2018-02-24 | Discharge: 2018-02-24 | Disposition: A | Discharge status: Home / Self Care | Payer: 59 | Attending: Family Medicine | Admitting: Family Medicine

## 2018-02-24 ENCOUNTER — Other Ambulatory Visit: Payer: Self-pay

## 2018-02-24 ENCOUNTER — Encounter: Payer: Self-pay | Admitting: Emergency Medicine

## 2018-02-24 DIAGNOSIS — J0100 Acute maxillary sinusitis, unspecified: Secondary | ICD-10-CM

## 2018-02-24 MED ORDER — HYDROCOD POLST-CPM POLST ER 10-8 MG/5ML PO SUER: 5.0000 mL | Freq: Every evening | ORAL | 0 refills | Status: DC | PRN

## 2018-02-24 MED ORDER — AMOXICILLIN-POT CLAVULANATE 875-125 MG PO TABS: 1.0000 | ORAL_TABLET | Freq: Two times a day (BID) | ORAL | 0 refills | Status: DC

## 2018-02-24 NOTE — Discharge Instructions (Signed)
Medications as prescribed. ° °Take care ° °Dr. Michaelia Beilfuss  °

## 2018-02-24 NOTE — ED Triage Notes (Signed)
Patient c/o cough, congestion, watery eyes, and sinus pressure since Saturday.  Patient denies fevers.

## 2018-02-24 NOTE — ED Provider Notes (Signed)
[facility]  CSN: [id number] Arrival date & time: 02/24/18  1638  History   Chief Complaint Chief Complaint  Patient presents with  . Sinus Problem  . Cough   HPI 57 year old male presents with the above complaints.  Patient reports that he has been sick since Saturday.  Reports cough, sinus pressure and congestion, watery eyes.  There are multiple sick contacts.  His most troublesome symptom is a sinus pressure and pain as well as the cough.  Also reports postnasal drip.  No fever.  No chills.  He has been taking Alka-Seltzer cold and sinus without resolution.  No relieving factors.  No other complaints.   PMH, Surgical Hx, Family Hx, Social History reviewed and updated as below.  Past Medical History:  Diagnosis Date  . Arthritis   . Hypercholesteremia    Patient Active Problem List   Diagnosis Date Noted  . Hyperglycemia 10/16/2015  . Special screening for malignant neoplasms, colon   . Benign neoplasm of ascending colon   . Hyperlipidemia 06/29/2015  . Annual physical exam 06/20/2015  . Screening for colon cancer 06/20/2015  . Encounter to establish care with new doctor 05/23/2015   Past Surgical History:  Procedure Laterality Date  . CERVICAL FUSION  2005   x2  . COLONOSCOPY WITH PROPOFOL N/A 07/10/2015   Procedure: COLONOSCOPY WITH PROPOFOL;  Surgeon: Lucilla Lame, MD;  Location: [place];  Service: Endoscopy;  Laterality: N/A;  . LUMBAR MICRODISCECTOMY Bilateral 2000   L4 and L5  . POLYPECTOMY  07/10/2015   Procedure: POLYPECTOMY;  Surgeon: Lucilla Lame, MD;  Location: [place];  Service: Endoscopy;;  . SHOULDER SURGERY Right 2014   Torn Supraspinatus   Family History Family History  Problem Relation Age of Onset  . Diabetes Mother   . Diabetes Brother    Social History Social History   Tobacco Use  . Smoking status: Former Research scientist (life sciences)  . Smokeless tobacco: Never Used  . Tobacco comment: quit 1988  Substance Use Topics  .  Alcohol use: Yes    Alcohol/week: 6.0 standard drinks    Types: 6 Cans of beer per week    Comment: occasional,   . Drug use: No   Allergies   Patient has no known allergies.  Review of Systems Review of Systems  Constitutional: Negative for fever.  HENT: Positive for congestion, postnasal drip, sinus pressure and sinus pain.   Respiratory: Positive for cough.    Physical Exam Triage Vital Signs ED Triage Vitals  Enc Vitals Group     BP 02/24/18 0856 (!) 155/102     Pulse Rate 02/24/18 0856 70     Resp 02/24/18 0856 16     Temp 02/24/18 0856 97.8 F (36.6 C)     Temp Source 02/24/18 0856 Oral     SpO2 02/24/18 0856 96 %     Weight 02/24/18 0854 220 lb (99.8 kg)     Height 02/24/18 0854 5\' 11"  (1.803 m)     Head Circumference --      Peak Flow --      Pain Score 02/24/18 0853 5     Pain Loc --      Pain Edu? --      Excl. in [place]? --    Updated Vital Signs BP (!) 155/102 (BP Location: Left Arm)   Pulse 70   Temp 97.8 F (36.6 C) (Oral)   Resp 16   Ht 5\' 11"  (1.803 m)  Wt 99.8 kg   SpO2 96%   BMI 30.68 kg/m   Visual Acuity Right Eye Distance:   Left Eye Distance:   Bilateral Distance:    Right Eye Near:   Left Eye Near:    Bilateral Near:     Physical Exam  Constitutional: He is oriented to person, place, and time. He appears well-developed. No distress.  HENT:  Head: Normocephalic and atraumatic.  Mouth/Throat: Oropharynx is clear and moist.  Maxillary sinus tenderness to palpation.  Cardiovascular: Normal rate and regular rhythm.  Pulmonary/Chest: Effort normal and breath sounds normal. He has no wheezes. He has no rales.  Neurological: He is alert and oriented to person, place, and time.  Psychiatric: He has a normal mood and affect. His behavior is normal.  Nursing note and vitals reviewed.  UC Treatments / Results  Labs (all labs ordered are listed, but only abnormal results are displayed) Labs Reviewed - No data to  display  EKG None  Radiology No results found.  Procedures Procedures (including critical care time)  Medications Ordered in UC Medications - No data to display  Initial Impression / Assessment and Plan / UC Course  I have reviewed the triage vital signs and the nursing notes.  Pertinent labs & imaging results that were available during my care of the patient were reviewed by me and considered in my medical decision making (see chart for details).    57 year old male presents with sinusitis.  Treating with Augmentin and Tussionex.  Final Clinical Impressions(s) / UC Diagnoses   Final diagnoses:  Acute maxillary sinusitis, recurrence not specified     Discharge Instructions     Medications as prescribed.  Take care  Dr. Lacinda Axon    ED Prescriptions    Medication Sig Dispense Auth. Provider   amoxicillin-clavulanate (AUGMENTIN) 875-125 MG tablet Take 1 tablet by mouth every 12 (twelve) hours. 14 tablet [place], Butterfield G, DO   chlorpheniramine-HYDROcodone (TUSSIONEX PENNKINETIC ER) 10-8 MG/5ML SUER Take 5 mLs by mouth at bedtime as needed. 60 mL Coral Spikes, DO     Controlled Substance Prescriptions [place] Controlled Substance Registry consulted? Not Applicable   Coral Spikes, DO 02/24/18 1004

## 2018-08-18 ENCOUNTER — Encounter: Payer: Self-pay | Admitting: Emergency Medicine

## 2018-08-18 ENCOUNTER — Emergency Department
Admission: EM | Admit: 2018-08-18 | Discharge: 2018-08-18 | Disposition: A | Discharge status: Home / Self Care | Payer: 59 | Attending: Emergency Medicine | Admitting: Emergency Medicine

## 2018-08-18 ENCOUNTER — Other Ambulatory Visit: Payer: Self-pay

## 2018-08-18 DIAGNOSIS — Z87891 Personal history of nicotine dependence: Secondary | ICD-10-CM | POA: Insufficient documentation

## 2018-08-18 DIAGNOSIS — Y658 Other specified misadventures during surgical and medical care: Secondary | ICD-10-CM | POA: Diagnosis not present

## 2018-08-18 DIAGNOSIS — T8131XA Disruption of external operation (surgical) wound, not elsewhere classified, initial encounter: Secondary | ICD-10-CM | POA: Insufficient documentation

## 2018-08-18 DIAGNOSIS — Z4889 Encounter for other specified surgical aftercare: Secondary | ICD-10-CM

## 2018-08-18 LAB — CBC WITH DIFFERENTIAL/PLATELET
Abs Immature Granulocytes: 0.01 10*3/uL (ref 0.00–0.07)
Basophils Absolute: 0.1 10*3/uL (ref 0.0–0.1)
Basophils Relative: 1 %
Eosinophils Absolute: 0.2 10*3/uL (ref 0.0–0.5)
Eosinophils Relative: 2 %
HCT: 53.4 % — ABNORMAL HIGH (ref 39.0–52.0)
Hemoglobin: 18.1 g/dL — ABNORMAL HIGH (ref 13.0–17.0)
Immature Granulocytes: 0 %
Lymphocytes Relative: 16 %
Lymphs Abs: 1.6 10*3/uL (ref 0.7–4.0)
MCH: 31.1 pg (ref 26.0–34.0)
MCHC: 33.9 g/dL (ref 30.0–36.0)
MCV: 91.8 fL (ref 80.0–100.0)
Monocytes Absolute: 0.9 10*3/uL (ref 0.1–1.0)
Monocytes Relative: 10 %
Neutro Abs: 6.9 10*3/uL (ref 1.7–7.7)
Neutrophils Relative %: 71 %
Platelets: 233 10*3/uL (ref 150–400)
RBC: 5.82 MIL/uL — ABNORMAL HIGH (ref 4.22–5.81)
RDW: 13 % (ref 11.5–15.5)
WBC: 9.7 10*3/uL (ref 4.0–10.5)
nRBC: 0 % (ref 0.0–0.2)

## 2018-08-18 LAB — COMPREHENSIVE METABOLIC PANEL
ALT: 36 U/L (ref 0–44)
AST: 29 U/L (ref 15–41)
Albumin: 4.4 g/dL (ref 3.5–5.0)
Alkaline Phosphatase: 48 U/L (ref 38–126)
Anion gap: 10 (ref 5–15)
BUN: 17 mg/dL (ref 6–20)
CO2: 24 mmol/L (ref 22–32)
Calcium: 10.4 mg/dL — ABNORMAL HIGH (ref 8.9–10.3)
Chloride: 106 mmol/L (ref 98–111)
Creatinine, Ser: 1.06 mg/dL (ref 0.61–1.24)
GFR calc Af Amer: >60 mL/min (ref >60)
GFR calc non Af Amer: >60 mL/min (ref >60)
Glucose, Bld: 112 mg/dL — ABNORMAL HIGH (ref 70–99)
Potassium: 4 mmol/L (ref 3.5–5.1)
Sodium: 140 mmol/L (ref 135–145)
Total Bilirubin: 0.4 mg/dL (ref 0.3–1.2)
Total Protein: 8.1 g/dL (ref 6.5–8.1)

## 2018-08-18 LAB — PROTIME-INR
INR: 0.9 (ref 0.8–1.2)
Prothrombin Time: 12.4 seconds (ref 11.4–15.2)

## 2018-08-18 MED ORDER — TRANEXAMIC ACID-NACL 1000-0.7 MG/100ML-% IV SOLN
1000.0000 mg | INTRAVENOUS | Status: AC
  Administered 2018-08-18: 1000 mg via INTRAVENOUS
  Filled 2018-08-18: qty 100

## 2018-08-18 MED ORDER — SODIUM CHLORIDE 0.9 % IV BOLUS
1000.0000 mL | Freq: Once | INTRAVENOUS | Status: AC
  Administered 2018-08-18: 21:00:00 1000 mL via INTRAVENOUS

## 2018-08-18 NOTE — ED Provider Notes (Signed)
[facility] Emergency Department Provider Note  ____________________________________________  Time seen: Approximately 8:55 PM  I have reviewed the triage vital signs and the nursing notes.   HISTORY  Chief Complaint Wound Check    HPI Douglas Friedman is a 58 y.o. male who presents the emergency department complaining of bleeding to a dermatological incision site.  Patient reports that he had a lesion on his back that was evaluated by dermatology.  Patient reports that it was completely removed by his dermatologist and was negative for malignancy.  Patient reports that this evening he was sitting on the couch when he felt a "pop" and started to bleed from the incision site.  Patient was concerned that he had popped through the sutures.  Patient does not visualize the area since it started to bleed.  Dermatology procedure occurred approximately 4 hours prior to onset of bleeding.  Patient denies any bleeding or clotting disorders.  He denies any anticoagulation.  Patient denies any pain to the site.         Past Medical History:  Diagnosis Date  . Arthritis   . Hypercholesteremia     Patient Active Problem List   Diagnosis Date Noted  . Hyperglycemia 10/16/2015  . Special screening for malignant neoplasms, colon   . Benign neoplasm of ascending colon   . Hyperlipidemia 06/29/2015  . Annual physical exam 06/20/2015  . Screening for colon cancer 06/20/2015  . Encounter to establish care with new doctor 05/23/2015    Past Surgical History:  Procedure Laterality Date  . CERVICAL FUSION  2005   x2  . COLONOSCOPY WITH PROPOFOL N/A 07/10/2015   Procedure: COLONOSCOPY WITH PROPOFOL;  Surgeon: Lucilla Lame, MD;  Location: [place];  Service: Endoscopy;  Laterality: N/A;  . LUMBAR MICRODISCECTOMY Bilateral 2000   L4 and L5  . POLYPECTOMY  07/10/2015   Procedure: POLYPECTOMY;  Surgeon: Lucilla Lame, MD;  Location: [place];  Service:  Endoscopy;;  . SHOULDER SURGERY Right 2014   Torn Supraspinatus    Prior to Admission medications   Medication Sig Start Date End Date Taking? Authorizing Provider  amoxicillin-clavulanate (AUGMENTIN) 875-125 MG tablet Take 1 tablet by mouth every 12 (twelve) hours. 02/24/18   Coral Spikes, DO  chlorpheniramine-HYDROcodone (TUSSIONEX PENNKINETIC ER) 10-8 MG/5ML SUER Take 5 mLs by mouth at bedtime as needed. 02/24/18   Coral Spikes, DO  Cholecalciferol (VITAMIN D PO) Take by mouth daily.    [provider]  Multiple Vitamin (MULTIVITAMIN) capsule Take 1 capsule by mouth daily.    [provider]    Allergies Patient has no known allergies.  Family History  Problem Relation Age of Onset  . Diabetes Mother   . Diabetes Brother     Social History Social History   Tobacco Use  . Smoking status: Former Research scientist (life sciences)  . Smokeless tobacco: Never Used  . Tobacco comment: quit 1988  Substance Use Topics  . Alcohol use: Yes    Alcohol/week: 6.0 standard drinks    Types: 6 Cans of beer per week    Comment: occasional,   . Drug use: No     Review of Systems  Constitutional: No fever/chills Eyes: No visual changes.  Cardiovascular: no chest pain. Respiratory: no cough. No SOB. Gastrointestinal: No abdominal pain.  No nausea, no vomiting.   Musculoskeletal: Negative for musculoskeletal pain. Skin: Positive for bleeding surgical site. Neurological: Negative for headaches, focal weakness or numbness. 10-point ROS otherwise negative.  ____________________________________________  PHYSICAL EXAM:  VITAL SIGNS: ED Triage Vitals [08/18/18 1940]  Enc Vitals Group     BP (!) 180/100     Pulse Rate 88     Resp 18     Temp 98.2 F (36.8 C)     Temp Source Oral     SpO2 99 %     Weight 230 lb (104.3 kg)     Height 5\' 11"  (1.803 m)     Head Circumference      Peak Flow      Pain Score 0     Pain Loc      Pain Edu?      Excl. in [place]?      Constitutional:  Alert and oriented. Well appearing and in no acute distress. Eyes: Conjunctivae are normal. PERRL. EOMI. Head: Atraumatic. Neck: No stridor.    Cardiovascular: Normal rate, regular rhythm. Normal S1 and S2.  Good peripheral circulation. Respiratory: Normal respiratory effort without tachypnea or retractions. Lungs CTAB. Good air entry to the bases with no decreased or absent breath sounds. Musculoskeletal: Full range of motion to all extremities. No gross deformities appreciated. Neurologic:  Normal speech and language. No gross focal neurologic deficits are appreciated.  Skin:  Skin is warm, dry and intact. No rash noted.  Visualization of patient's back reveals a closed surgical incision site.  Sutures are intact.  There is slow but moderate to significant bleeding throughout the incision site.  Surrounding ecchymosis and edema concerning for underlying hematoma.  Area is nontender to palpation.  No dehiscence of the wound with palpation Psychiatric: Mood and affect are normal. Speech and behavior are normal. Patient exhibits appropriate insight and judgement.   ____________________________________________   LABS (all labs ordered are listed, but only abnormal results are displayed)  Labs Reviewed  COMPREHENSIVE METABOLIC PANEL - Abnormal; Notable for the following components:      Result Value   Glucose, Bld 112 (*)    Calcium 10.4 (*)    All other components within normal limits  CBC WITH DIFFERENTIAL/PLATELET - Abnormal; Notable for the following components:   RBC 5.82 (*)    Hemoglobin 18.1 (*)    HCT 53.4 (*)    All other components within normal limits  PROTIME-INR   ____________________________________________  EKG   ____________________________________________  RADIOLOGY   No results found.  ____________________________________________    PROCEDURES  Procedure(s) performed:    Marland KitchenMarland KitchenLaceration Repair Date/Time: 08/18/2018 10:54 PM Performed by: Darletta Moll, PA-C Authorized by: Darletta Moll, PA-C   Consent:    Consent obtained:  Verbal   Consent given by:  Patient   Risks discussed:  Pain Anesthesia (see MAR for exact dosages):    Anesthesia method:  Local infiltration   Local anesthetic:  Lidocaine 1% w/o epi Laceration details:    Location:  Trunk   Trunk location:  Upper back   Length (cm):  8 Exploration:    Hemostasis achieved with:  Epinephrine (and TXA (IV administration)) Post-procedure details:    Dressing:  Bulky dressing   Patient tolerance of procedure:  Tolerated well, no immediate complications Comments:     Bleeding surgical site identified.  External sutures are all intact with no dehiscence of the wound.  Given amount of ecchymosis, edema and bleeding from wound, I suspect underlying hematoma with bleedthrough.  Patient was given IV TXA, local infiltration with lidocaine with epi.  This causes good cessation of bleeding.  With pressure over hematoma, small amounts of blood are  expressed between edges of wound, however there is no active bleeding.      Medications  sodium chloride 0.9 % bolus 1,000 mL (1,000 mLs Intravenous New Bag/Given 08/18/18 2106)  tranexamic acid (CYKLOKAPRON) IVPB 1,000 mg (0 mg Intravenous Stopped 08/18/18 2135)     ____________________________________________   INITIAL IMPRESSION / ASSESSMENT AND PLAN / ED COURSE  Pertinent labs & imaging results that were available during my care of the patient were reviewed by me and considered in my medical decision making (see chart for details).  Review of the [place] CSRS was performed in accordance of the [place] prior to dispensing any controlled drugs.  Clinical Course as of Aug 18 2255  Tue Aug 18, 2018  2117 Patient presented to the emergency department with a complaint of bleeding surgical site to the back.  Patient had a dermatology procedure with a skin lesion removed to the posterior thorax.  External sutures are all intact but  patient does have moderate to significant bleeding through the incision site.  Patient does have a sizable hematoma underlying surgical incision.  At this time, I discussed medication options with pharmacy.  At this time I will try a intravenous TXA.  I am hesitant to begin injecting the area with lidocaine with epi as I do not have exact location of bleeding.  However if TXA does not slow bleeding, I will inject area with lidocaine with epi.  If this is also not successful, I will open the surgical site, to locate bleeder to see if I can cauterize or ligate with absorbable suture any bleeder.  Patient will also have labs to ensure no thrombocytopenia, or prolonged PT/INR.   [JC]    Clinical Course User Index [JC] Arden Tinoco, Charline Bills, PA-C          Patient's diagnosis is consistent with encounter for postoperative wound check.  Patient presented to the emergency department with bleeding wound to the back.  Patient reports that he had a skin excision of a lesion that was noncancerous from the left back.  Patient reports that he was at home, felt a "pop" and then immediately had heavy bleeding from the wound.  On assessment, edges were still approximated and sutures, at least externally were still intact.  There was moderate to significant bleeding from the wound site with significant surrounding ecchymosis and edema.  I suspect a venule had a ruptured further trauma from ruptured internal suture.  Labs were reassuring with no evidence of thrombocytopenia, anemia or prolonged clotting time.  TXA administered IV.  This resulted in significant slowing of bleeding.  There was one area that continued to bleed freely.  This area was injected with lidocaine with epi.  After this, wound was reassessed.  No active bleeding.  On palpation of the area, minimal amounts of blood would be expressed consistent with hematoma bleeding through the surgical site, however there was no active bleeding.  Bulky dressing  applied.  Wound care instructions discussed with patient.  If bleeding returns or return to the emergency department..  Patient is given ED precautions to return to the ED for any worsening or new symptoms.     ____________________________________________  FINAL CLINICAL IMPRESSION(S) / ED DIAGNOSES  Final diagnoses:  Encounter for postoperative wound check      NEW MEDICATIONS STARTED DURING THIS VISIT:  ED Discharge Orders    None          This chart was dictated using voice recognition software/Dragon. Despite best efforts to proofread,  errors can occur which can change the meaning. Any change was purely unintentional.    Darletta Moll, PA-C 08/18/18 2258    Nena Polio, MD 08/19/18 [phone]

## 2018-08-18 NOTE — ED Triage Notes (Addendum)
Patient ambulatory to triage with steady gait, without difficulty or distress noted, mask in place; pt reports skin lesion removed at 4pm from back at dermatologist and stitches have opened up; large saturated bandaid in place covered by D&I gauze

## 2018-08-18 NOTE — ED Notes (Signed)
Patient states he had spot removed from back today at dermatologist. Reports he had 5 internal and 15 external sutures placed. Patient reports a couple of hours after returning home he was sitting back on the couch when he felt a "pop" and noticed blood coming out from his sutures. Upon assessment, there is a steady slow flow of blood noted from laceration on patient's back, however all visible sutures appear intact. Patient also has large amount of swelling under skin noted around sutures. No redness or heat noted.

## 2019-01-28 ENCOUNTER — Other Ambulatory Visit: Payer: Self-pay | Admitting: Orthopedic Surgery

## 2019-01-29 ENCOUNTER — Other Ambulatory Visit: Payer: Self-pay

## 2019-01-29 ENCOUNTER — Encounter
Admission: RE | Admit: 2019-01-29 | Discharge: 2019-01-29 | Disposition: A | Discharge status: Home / Self Care | Payer: 59 | Source: Ambulatory Visit | Attending: Orthopedic Surgery | Admitting: Orthopedic Surgery

## 2019-01-29 NOTE — Patient Instructions (Signed)
Your procedure is scheduled on: 02/04/19 Report to [facility]. To find out your arrival time please call [phone] between 1PM - 3PM on 02/03/19.  Remember: Instructions that are not followed completely may result in serious medical risk, up to and including death, or upon the discretion of your surgeon and anesthesiologist your surgery may need to be rescheduled.     _X__ 1. Do not eat food after midnight the night before your procedure.                 No gum chewing or hard candies. You may drink clear liquids up to 2 hours                 before you are scheduled to arrive for your surgery- DO not drink clear                 liquids within 2 hours of the start of your surgery.                 Clear Liquids include:  water, apple juice without pulp, clear carbohydrate                 drink such as Clearfast or Gatorade, Black Coffee or Tea (Do not add                 anything to coffee or tea). Diabetics water only  __X__2.  On the morning of surgery brush your teeth with toothpaste and water, you                 may rinse your mouth with mouthwash if you wish.  Do not swallow any              toothpaste of mouthwash.     _X__ 3.  No Alcohol for 24 hours before or after surgery.   _X__ 4.  Do Not Smoke or use e-cigarettes For 24 Hours Prior to Your Surgery.                 Do not use any chewable tobacco products for at least 6 hours prior to                 surgery.  ____  5.  Bring all medications with you on the day of surgery if instructed.   __X__  6.  Notify your doctor if there is any change in your medical condition      (cold, fever, infections).     Do not wear jewelry, make-up, hairpins, clips or nail polish. Do not wear lotions, powders, or perfumes.  Do not shave 48 hours prior to surgery. Men may shave face and neck. Do not bring valuables to the hospital.    [facility] is not responsible for any belongings  or valuables.  Contacts, dentures/partials or body piercings may not be worn into surgery. Bring a case for your contacts, glasses or hearing aids, a denture cup will be supplied. Leave your suitcase in the car. After surgery it may be brought to your room. For patients admitted to the hospital, discharge time is determined by your treatment team.   Patients discharged the day of surgery will not be allowed to drive home.   Please read over the following fact sheets that you were given:   MRSA Information  __X__ Take these medicines the morning of surgery with A SIP OF WATER:  1. none  2.   3.   4.  5.  6.  ____ Fleet Enema (as directed)   ____ Use CHG Soap/SAGE wipes as directed  ____ Use inhalers on the day of surgery  ____ Stop metformin/Janumet/Farxiga 2 days prior to surgery    ____ Take 1/2 of usual insulin dose the night before surgery. No insulin the morning          of surgery.   ____ Stop Blood Thinners Coumadin/Plavix/Xarelto/Pleta/Pradaxa/Eliquis/Effient/Aspirin  on   Or contact your Surgeon, Cardiologist or Medical Doctor regarding  ability to stop your blood thinners  __X__ Stop Anti-inflammatories 7 days before surgery such as Advil, Ibuprofen, Motrin,  BC or Goodies Powder, Naprosyn, Naproxen, Aleve, Aspirin    __X__ Stop all herbal supplements, fish oil or vitamin E until after surgery.    ____ Bring C-Pap to the hospital.     Telephone interview. instrictions given over the phone. Patient verbalized understanding./cn

## 2019-02-01 ENCOUNTER — Other Ambulatory Visit: Payer: Self-pay

## 2019-02-01 ENCOUNTER — Other Ambulatory Visit
Admission: RE | Admit: 2019-02-01 | Discharge: 2019-02-01 | Disposition: A | Discharge status: Home / Self Care | Payer: 59 | Source: Ambulatory Visit | Attending: Orthopedic Surgery | Admitting: Orthopedic Surgery

## 2019-02-01 DIAGNOSIS — Z20828 Contact with and (suspected) exposure to other viral communicable diseases: Secondary | ICD-10-CM | POA: Diagnosis not present

## 2019-02-01 DIAGNOSIS — Z01812 Encounter for preprocedural laboratory examination: Secondary | ICD-10-CM | POA: Insufficient documentation

## 2019-02-01 LAB — SARS CORONAVIRUS 2 (TAT 6-24 HRS): SARS Coronavirus 2: NEGATIVE

## 2019-02-04 ENCOUNTER — Ambulatory Visit
Admission: RE | Admit: 2019-02-04 | Discharge: 2019-02-04 | Disposition: A | Discharge status: Home / Self Care | Payer: 59 | Attending: Orthopedic Surgery | Admitting: Orthopedic Surgery

## 2019-02-04 ENCOUNTER — Ambulatory Visit: Payer: 59 | Admitting: Anesthesiology

## 2019-02-04 ENCOUNTER — Other Ambulatory Visit: Payer: Self-pay

## 2019-02-04 ENCOUNTER — Encounter: Payer: Self-pay | Admitting: *Deleted

## 2019-02-04 ENCOUNTER — Encounter
Admission: RE | Disposition: A | Discharge status: Home / Self Care | Payer: Self-pay | Source: Home / Self Care | Attending: Orthopedic Surgery

## 2019-02-04 ENCOUNTER — Ambulatory Visit: Payer: 59

## 2019-02-04 DIAGNOSIS — M24841 Other specific joint derangements of right hand, not elsewhere classified: Secondary | ICD-10-CM | POA: Insufficient documentation

## 2019-02-04 DIAGNOSIS — Z87891 Personal history of nicotine dependence: Secondary | ICD-10-CM | POA: Diagnosis not present

## 2019-02-04 DIAGNOSIS — M180 Bilateral primary osteoarthritis of first carpometacarpal joints: Secondary | ICD-10-CM | POA: Diagnosis not present

## 2019-02-04 DIAGNOSIS — X58XXXA Exposure to other specified factors, initial encounter: Secondary | ICD-10-CM | POA: Insufficient documentation

## 2019-02-04 DIAGNOSIS — M24842 Other specific joint derangements of left hand, not elsewhere classified: Secondary | ICD-10-CM | POA: Diagnosis not present

## 2019-02-04 DIAGNOSIS — S63041A Subluxation of carpometacarpal joint of right thumb, initial encounter: Secondary | ICD-10-CM | POA: Diagnosis not present

## 2019-02-04 DIAGNOSIS — Z96691 Finger-joint replacement of right hand: Secondary | ICD-10-CM

## 2019-02-04 DIAGNOSIS — S63042A Subluxation of carpometacarpal joint of left thumb, initial encounter: Secondary | ICD-10-CM | POA: Insufficient documentation

## 2019-02-04 HISTORY — PX: FINGER ARTHROSCOPY WITH CARPOMETACARPEL (CMC) ARTHROPLASTY: SHX5629

## 2019-02-04 SURGERY — FINGER ARTHROSCOPY WITH CARPOMETACARPEL (CMC) ARTHROPLASTY
Anesthesia: General | Site: Finger | Laterality: Right

## 2019-02-04 MED ORDER — CEFAZOLIN SODIUM-DEXTROSE 2-4 GM/100ML-% IV SOLN
INTRAVENOUS | Status: AC
  Filled 2019-02-04: qty 100

## 2019-02-04 MED ORDER — CEFAZOLIN SODIUM-DEXTROSE 2-4 GM/100ML-% IV SOLN
2.0000 g | Freq: Once | INTRAVENOUS | Status: AC
  Administered 2019-02-04: 12:00:00 2 g via INTRAVENOUS

## 2019-02-04 MED ORDER — FENTANYL CITRATE (PF) 100 MCG/2ML IJ SOLN
INTRAMUSCULAR | Status: AC
  Filled 2019-02-04: qty 2

## 2019-02-04 MED ORDER — MIDAZOLAM HCL 2 MG/2ML IJ SOLN
INTRAMUSCULAR | Status: AC
  Filled 2019-02-04: qty 2

## 2019-02-04 MED ORDER — ACETAMINOPHEN 10 MG/ML IV SOLN
INTRAVENOUS | Status: AC
  Filled 2019-02-04: qty 100

## 2019-02-04 MED ORDER — BUPIVACAINE HCL (PF) 0.5 % IJ SOLN
INTRAMUSCULAR | Status: DC | PRN
  Administered 2019-02-04: 10 mL

## 2019-02-04 MED ORDER — ONDANSETRON HCL 4 MG/2ML IJ SOLN
INTRAMUSCULAR | Status: DC | PRN
  Administered 2019-02-04: 4 mg via INTRAVENOUS

## 2019-02-04 MED ORDER — LIDOCAINE HCL (CARDIAC) PF 100 MG/5ML IV SOSY
PREFILLED_SYRINGE | INTRAVENOUS | Status: DC | PRN
  Administered 2019-02-04: 100 mg via INTRAVENOUS

## 2019-02-04 MED ORDER — PROPOFOL 10 MG/ML IV BOLUS
INTRAVENOUS | Status: AC
  Filled 2019-02-04: qty 20

## 2019-02-04 MED ORDER — MIDAZOLAM HCL 2 MG/2ML IJ SOLN
INTRAMUSCULAR | Status: DC | PRN
  Administered 2019-02-04: 2 mg via INTRAVENOUS

## 2019-02-04 MED ORDER — PHENYLEPHRINE HCL (PRESSORS) 10 MG/ML IV SOLN
INTRAVENOUS | Status: DC | PRN
  Administered 2019-02-04 (×2): 100 ug via INTRAVENOUS

## 2019-02-04 MED ORDER — FAMOTIDINE 20 MG PO TABS
ORAL_TABLET | ORAL | Status: AC
  Administered 2019-02-04: 20 mg via ORAL
  Filled 2019-02-04: qty 1

## 2019-02-04 MED ORDER — ACETAMINOPHEN 10 MG/ML IV SOLN
INTRAVENOUS | Status: DC | PRN
  Administered 2019-02-04: 1000 mg via INTRAVENOUS

## 2019-02-04 MED ORDER — NEOMYCIN-POLYMYXIN B GU 40-200000 IR SOLN
Status: DC | PRN
  Administered 2019-02-04: 2 mL

## 2019-02-04 MED ORDER — FENTANYL CITRATE (PF) 100 MCG/2ML IJ SOLN
INTRAMUSCULAR | Status: AC
  Administered 2019-02-04: 25 ug via INTRAVENOUS
  Filled 2019-02-04: qty 2

## 2019-02-04 MED ORDER — PROPOFOL 10 MG/ML IV BOLUS
INTRAVENOUS | Status: DC | PRN
  Administered 2019-02-04: 150 mg via INTRAVENOUS

## 2019-02-04 MED ORDER — DEXAMETHASONE SODIUM PHOSPHATE 10 MG/ML IJ SOLN
INTRAMUSCULAR | Status: DC | PRN
  Administered 2019-02-04: 10 mg via INTRAVENOUS

## 2019-02-04 MED ORDER — FENTANYL CITRATE (PF) 100 MCG/2ML IJ SOLN
25.0000 ug | INTRAMUSCULAR | Status: DC | PRN
  Administered 2019-02-04 (×5): 25 ug via INTRAVENOUS

## 2019-02-04 MED ORDER — LACTATED RINGERS IV SOLN
INTRAVENOUS | Status: DC
  Administered 2019-02-04 (×2): via INTRAVENOUS

## 2019-02-04 MED ORDER — FAMOTIDINE 20 MG PO TABS
20.0000 mg | ORAL_TABLET | Freq: Once | ORAL | Status: AC
  Administered 2019-02-04: 12:00:00 20 mg via ORAL

## 2019-02-04 MED ORDER — FENTANYL CITRATE (PF) 100 MCG/2ML IJ SOLN
INTRAMUSCULAR | Status: DC | PRN
  Administered 2019-02-04 (×4): 50 ug via INTRAVENOUS

## 2019-02-04 MED ORDER — OXYCODONE-ACETAMINOPHEN 5-325 MG PO TABS: 1.0000 | ORAL_TABLET | Freq: Four times a day (QID) | ORAL | 0 refills | Status: DC | PRN

## 2019-02-04 MED ORDER — ONDANSETRON HCL 4 MG/2ML IJ SOLN: 4.0000 mg | Freq: Once | INTRAMUSCULAR | Status: DC | PRN

## 2019-02-04 SURGICAL SUPPLY — 36 items
BLADE OSC/SAGITTAL 5.5X25 (BLADE) ×3 IMPLANT
BNDG CMPR STD VLCR NS LF 5.8X3 (GAUZE/BANDAGES/DRESSINGS) ×1
BNDG CONFORM 3 STRL LF (GAUZE/BANDAGES/DRESSINGS) ×1 IMPLANT
BNDG ELASTIC 3X5.8 VLCR NS LF (GAUZE/BANDAGES/DRESSINGS) ×3 IMPLANT
CAST PADDING 3X4FT ST 30246 (SOFTGOODS) ×2
COVER WAND RF STERILE (DRAPES) ×3 IMPLANT
CUFF TOURN SGL QUICK 18X4 (TOURNIQUET CUFF) ×2 IMPLANT
DRAPE FLUOR MINI C-ARM 54X84 (DRAPES) ×3 IMPLANT
ELECT CAUTERY BLADE 6.4 (BLADE) ×3 IMPLANT
GAUZE SPONGE 4X4 12PLY STRL (GAUZE/BANDAGES/DRESSINGS) ×3 IMPLANT
GAUZE XEROFORM 1X8 LF (GAUZE/BANDAGES/DRESSINGS) ×3 IMPLANT
GLOVE SURG SYN 9.0  PF PI (GLOVE) ×2
GLOVE SURG SYN 9.0 PF PI (GLOVE) ×1 IMPLANT
GOWN SRG 2XL LVL 4 RGLN SLV (GOWNS) ×1 IMPLANT
GOWN STRL NON-REIN 2XL LVL4 (GOWNS) ×3
GOWN STRL REUS W/ TWL LRG LVL3 (GOWN DISPOSABLE) ×1 IMPLANT
GOWN STRL REUS W/TWL LRG LVL3 (GOWN DISPOSABLE) ×6
KIT TURNOVER KIT A (KITS) ×3 IMPLANT
NS IRRIG 500ML POUR BTL (IV SOLUTION) ×3 IMPLANT
PACK EXTREMITY ARMC (MISCELLANEOUS) ×3 IMPLANT
PAD CAST CTTN 3X4 STRL (SOFTGOODS) ×1 IMPLANT
PADDING CAST 3IN STRL (MISCELLANEOUS) ×2
PADDING CAST BLEND 3X4 STRL (MISCELLANEOUS) IMPLANT
PADDING CAST COTTON 3X4 STRL (SOFTGOODS) ×1
SCALPEL PROTECTED #15 DISP (BLADE) ×6 IMPLANT
SPLINT CAST 1 STEP 3X12 (MISCELLANEOUS) ×3 IMPLANT
SPLINT WRIST M LT TX990308 (SOFTGOODS) ×1 IMPLANT
SPONGE SURGIFOAM ABS GEL 12-7 (HEMOSTASIS) ×2 IMPLANT
SUT ETHILON 4-0 (SUTURE) ×3
SUT ETHILON 4-0 FS2 18XMFL BLK (SUTURE) ×1
SUT VIC AB 0 CT2 27 (SUTURE) ×6 IMPLANT
SUT VIC AB 3-0 SH 27 (SUTURE) ×3
SUT VIC AB 3-0 SH 27X BRD (SUTURE) ×1 IMPLANT
SUTURE ETHLN 4-0 FS2 18XMF BLK (SUTURE) ×1 IMPLANT
SYSTEM IMPLANT TIGHTROPE MINI (Anchor) ×3 IMPLANT
WIRE Z .062 C-WIRE SPADE TIP (WIRE) ×4 IMPLANT

## 2019-02-04 NOTE — Transfer of Care (Signed)
Immediate Anesthesia Transfer of Care Note  Patient: Douglas Friedman  Procedure(s) Performed: FINGER ARTHROSCOPY WITH CARPOMETACARPEL (CMC) ARTHROPLASTY (Right Finger)  Patient Location: PACU  Anesthesia Type:General  Level of Consciousness: awake, alert  and oriented  Airway & Oxygen Therapy: Patient Spontanous Breathing and Patient connected to face mask oxygen  Post-op Assessment: Report given to RN and Post -op Vital signs reviewed and stable  Post vital signs: Reviewed and stable  Last Vitals:  Vitals Value Taken Time  BP    Temp    Pulse 74 02/04/19 1359  Resp 13 02/04/19 1359  SpO2 100 % 02/04/19 1359  Vitals shown include unvalidated device data.  Last Pain:  Vitals:   02/04/19 1107  TempSrc: Tympanic  PainSc: 5          Complications: No apparent anesthesia complications

## 2019-02-04 NOTE — Discharge Instructions (Addendum)
AMBULATORY SURGERY  DISCHARGE INSTRUCTIONS   1) The drugs that you were given will stay in your system until tomorrow so for the next 24 hours you should not:  A) Drive an automobile B) Make any legal decisions C) Drink any alcoholic beverage   2) You may resume regular meals tomorrow.  Today it is better to start with liquids and gradually work up to solid foods.  You may eat anything you prefer, but it is better to start with liquids, then soup and crackers, and gradually work up to solid foods.   3) Please notify your doctor immediately if you have any unusual bleeding, trouble breathing, redness and pain at the surgery site, drainage, fever, or pain not relieved by medication.    4) Additional Instructions:        Please contact your physician with any problems or Same Day Surgery at [phone], Monday through Friday 6 am to 4 pm, or [facility] number at [phone].  Keep arm elevated is much as you can this weekend.  Work on finger motion but not the thumb.  Okay to loosen Ace wrap if fingers swell.  Pain medicine as directed.  Ice to the back of the hand may help with pain as well.  Keep dressing clean and dry

## 2019-02-04 NOTE — Anesthesia Preprocedure Evaluation (Signed)
Anesthesia Evaluation  Patient identified by MRN, date of birth, ID band  Reviewed: Allergy & Precautions, NPO status , Patient's Chart, lab work & pertinent test results  History of Anesthesia Complications Negative for: history of anesthetic complications  Airway Mallampati: II       Dental   Pulmonary neg sleep apnea, neg COPD, Not current smoker, former smoker,           Cardiovascular (-) hypertension(-) Past MI and (-) CHF (-) dysrhythmias (-) Valvular Problems/Murmurs     Neuro/Psych neg Seizures    GI/Hepatic Neg liver ROS, neg GERD  ,  Endo/Other  neg diabetes  Renal/GU negative Renal ROS     Musculoskeletal   Abdominal   Peds  Hematology   Anesthesia Other Findings   Reproductive/Obstetrics                             Anesthesia Physical Anesthesia Plan  ASA: I  Anesthesia Plan: General   Post-op Pain Management:    Induction: Intravenous  PONV Risk Score and Plan: 2 and Dexamethasone and Ondansetron  Airway Management Planned: LMA  Additional Equipment:   Intra-op Plan:   Post-operative Plan:   Informed Consent: I have reviewed the patients History and Physical, chart, labs and discussed the procedure including the risks, benefits and alternatives for the proposed anesthesia with the patient or authorized representative who has indicated his/her understanding and acceptance.       Plan Discussed with:   Anesthesia Plan Comments:         Anesthesia Quick Evaluation

## 2019-02-04 NOTE — H&P (Signed)
Reviewed paper H+P, will be scanned into chart. No changes noted.  

## 2019-02-04 NOTE — Anesthesia Postprocedure Evaluation (Signed)
Anesthesia Post Note  Patient: Douglas Friedman  Procedure(s) Performed: FINGER ARTHROSCOPY WITH CARPOMETACARPEL (CMC) ARTHROPLASTY (Right Finger)  Patient location during evaluation: PACU Anesthesia Type: General Level of consciousness: awake and alert Pain management: pain level controlled Vital Signs Assessment: post-procedure vital signs reviewed and stable Respiratory status: spontaneous breathing and respiratory function stable Cardiovascular status: stable Anesthetic complications: no     Last Vitals:  Vitals:   02/04/19 1401 02/04/19 1415  BP: 134/81   Pulse: 77 70  Resp: 10 16  Temp: (!) 36.1 C   SpO2: 98% 98%    Last Pain:  Vitals:   02/04/19 1429  TempSrc:   PainSc: 6                  Evamarie Raetz K

## 2019-02-04 NOTE — Op Note (Signed)
02/04/2019  1:57 PM  PATIENT:  Douglas Friedman  58 y.o. male  PRE-OPERATIVE DIAGNOSIS:  PRIMARY OSTEOARTHRITIS OF BOTH CARPOMETACARPAL JOINT  POST-OPERATIVE DIAGNOSIS:  PRIMARY OSTEOARTHRITIS OF BOTH CARPOMETACARPAL   PROCEDURE:  Procedure(s): FINGER ARTHROSCOPY WITH CARPOMETACARPEL (CMC) ARTHROPLASTY (Right)  SURGEON: Laurene Footman, MD  ASSISTANTS: None  ANESTHESIA:   general  EBL:  Total I/O In: 1000 [I.V.:1000] Out: 2 [Blood:2]  BLOOD ADMINISTERED:none  DRAINS: none   LOCAL MEDICATIONS USED:  MARCAINE     SPECIMEN:  No Specimen  DISPOSITION OF SPECIMEN:  N/A  COUNTS:  YES  TOURNIQUET:   Total Tourniquet Time Documented: Upper Arm (Left) - 75 minutes Total: Upper Arm (Left) - 75 minutes   IMPLANTS: Mini tight rope x1  DICTATION: .Dragon Dictation patient was brought to the operating room and after adequate anesthesia was obtained the right arm was prepped and draped in sterile fashion.  After patient identification and timeout procedures were completed tourniquet was raised to 2 or 50 mmHg.  Incision was made between the dorsal and volar skin centered over the [facility] joint.  Skin and subcutaneous tissue were divided preserving cutaneous nerves.  The tendons were split and the capsule opened to provide exposure of the joint the trapezium was then removed in pieces showing extensive degenerative changes the [facility] joint the scaphoid appeared essentially normal.  After getting adequate resection of the trapezium the guide was used and a small incision made at the base of the second metacarpal a wire passed between the base of the first into the proximal second metacarpal and the tight mini tight rope suture passed with the anchor fixed to the base of the first metacarpal.  The thumb was held in the appropriate position as this the suture was tightened to after metal piece on the opposite side was tied down to the base of the metacarpal and this gave [place] good position of the first  base of the first metacarpal in line with the other metacarpals the it appeared stable to pistoning under mini C arm.  This suture was then tied down and cut short with the wound then infiltrated with 10 cc of half percent Sensorcaine to aid in postop analgesia.  The capsule was then repaired using 0 Vicryl followed by - 0 Vicryl subcutaneously and a 4-0 nylon in simple erupted fashion for all skin incisions.  Xeroform 4 x 4's web roll and a thumb spica splint were applied and tourniquet let down at the close of the case  PLAN OF CARE: Discharge to home after PACU  PATIENT DISPOSITION:  PACU - hemodynamically stable.

## 2019-02-04 NOTE — Anesthesia Post-op Follow-up Note (Signed)
Anesthesia QCDR form completed.        

## 2019-02-05 ENCOUNTER — Encounter: Payer: Self-pay | Admitting: Orthopedic Surgery

## 2019-02-23 ENCOUNTER — Ambulatory Visit: Payer: 59 | Attending: Orthopedic Surgery | Admitting: Occupational Therapy

## 2019-02-23 ENCOUNTER — Other Ambulatory Visit: Payer: Self-pay

## 2019-02-23 DIAGNOSIS — M6281 Muscle weakness (generalized): Secondary | ICD-10-CM | POA: Diagnosis present

## 2019-02-23 DIAGNOSIS — M79641 Pain in right hand: Secondary | ICD-10-CM | POA: Insufficient documentation

## 2019-02-23 DIAGNOSIS — M25641 Stiffness of right hand, not elsewhere classified: Secondary | ICD-10-CM | POA: Insufficient documentation

## 2019-02-23 NOTE — Therapy (Signed)
[facility] [address] [place], [address] Phone: [phone]   Fax:  [phone]  Occupational Therapy Evaluation  Patient Details  Name: Douglas Friedman MRN: [id number] Date of Birth: 01/15/1961 Referring Provider (OT): Rudene Christians   Encounter Date: 02/23/2019  OT End of Session - 02/23/19 1055    Visit Number  1    Number of Visits  12    Date for OT Re-Evaluation  04/06/19    OT Start Time  0915    OT Stop Time  0958    OT Time Calculation (min)  43 min    Activity Tolerance  Patient tolerated treatment well    Behavior During Therapy  [facility] for tasks assessed/performed       Past Medical History:  Diagnosis Date  . Arthritis   . Hypercholesteremia     Past Surgical History:  Procedure Laterality Date  . CERVICAL FUSION  2005   x2  . COLONOSCOPY WITH PROPOFOL N/A 07/10/2015   Procedure: COLONOSCOPY WITH PROPOFOL;  Surgeon: Lucilla Lame, MD;  Location: [place];  Service: Endoscopy;  Laterality: N/A;  . FINGER ARTHROSCOPY WITH CARPOMETACARPEL ([place]) ARTHROPLASTY Right 02/04/2019   Procedure: FINGER ARTHROSCOPY WITH CARPOMETACARPEL [facility]) ARTHROPLASTY;  Surgeon: Hessie Knows, MD;  Location: [facility];  Service: Orthopedics;  Laterality: Right;  . LUMBAR MICRODISCECTOMY Bilateral 2000   L4 and L5  . POLYPECTOMY  07/10/2015   Procedure: POLYPECTOMY;  Surgeon: Lucilla Lame, MD;  Location: [place];  Service: Endoscopy;;  . SHOULDER SURGERY Right 2014   Torn Supraspinatus    There were no vitals filed for this visit.  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  Yes    Pain Score  1     Pain Location  Hand   Thumb   Pain Orientation  Left    Pain Descriptors / Indicators  Sore    Pain Type  Surgical pain        [facility] OT Assessment - 02/23/19 0001      Assessment   Medical Diagnosis  R CMC arthroplasty    Referring Provider (OT)  Rudene Christians    Onset Date/Surgical Date  02/04/19    Hand Dominance  Right    Next MD Visit  --   03/08/19     Precautions   Required Braces or Orthoses  --   prefab thumb spica     Home  Environment   Lives With  Spouse      Prior Function   Vocation  Full time employment    Leisure  Building jet engins, work on cars, play banjo little , very active      AROM   Right Wrist Extension  60 Degrees    Right Wrist Flexion  74 Degrees    Right Wrist  Radial Deviation  14 Degrees    Right Wrist Ulnar Deviation  30 Degrees      Right Hand AROM   R Thumb MCP 0-60  40 Degrees    R Thumb IP 0-80  65 Degrees    R Thumb Radial ABduction/ADduction 0-55  55    R Thumb Palmar ABduction/ADduction 0-45  54    R Thumb Opposition to Index  --   2nd fold of 5th    R Index  MCP 0-90  85 Degrees    R Index PIP 0-100  95 Degrees    R Long  MCP 0-90  85 Degrees    R Long PIP 0-100  95 Degrees    R Ring  MCP 0-90  90 Degrees    R Ring PIP 0-100  90 Degrees    R Little  MCP 0-90  90 Degrees    R Little PIP 0-100  90 Degrees        Review HEP after heat - hand out provided  And done ice at the end    Contrast  AROM for thumb PA and RA  Thumb circular motion Opposition to digits -  Tendon glides  Wrist flexion , ext, RD, UD  AROM for all and pain or pull less than 1-2/10  Splint on at all time when using hand or up and about  Can do ice at end of session             OT Education - 02/23/19 1055    Education Details  findings for eval and HEP review     Person(s) Educated  Patient    Methods  Explanation;Demonstration;Tactile cues;Verbal cues    Comprehension  Verbalized understanding;Returned demonstration;Verbal cues required       OT Short Term Goals - 02/23/19 1059      OT SHORT TERM GOAL #1   Title  Pt to be independent in HEP to decrease scar tissue, increase ROM and strength to wean out of splint    Baseline  no knowledge    Time  3    Period  Weeks    Status  New    Target Date  03/16/19        OT Long Term Goals - 02/23/19 1100      OT LONG TERM GOAL #1   Title  Pt to show R thumb and wrist AROM WNL with out pain to initiate use in ADL's without pain    Baseline  no using hand - thumb spica on    Time  2    Period  Weeks    Status  New    Target Date  03/09/19      OT LONG TERM GOAL #2   Title  Grip and prehension strength in R hand improve to [facility] and more than 60% compare to L to perform ADL;s and IADL's without pain or sypmtoms    Baseline  2 1/2 wks s/p    Time  6    Period  Weeks    Status  New    Target Date  04/06/19      OT LONG TERM GOAL #3   Title  Function score on PRWHE improve with more than 25 points    Baseline  PRWHE function score at eval 43/50    Time  6    Period  Weeks    Status  New    Target Date  04/06/19  Plan - 02/23/19 1056    Clinical Impression Statement  Pt is about 2 1/2 wks s/p R CMC arthroplasty - pt report only soreness - show increase scar tissue, decrease ROM and strength - limiting his functional use of R dominant hand in ADl's and IADl's    OT Occupational Profile and History  Problem Focused Assessment - Including review of records relating to presenting problem    Occupational performance deficits (Please refer to evaluation for details):  ADL's;IADL's;Work;Play;Leisure;Social Participation    Body Structure / Function / Physical Skills  ADL;Flexibility;ROM;UE functional use;FMC;Scar mobility;Dexterity;Edema;Pain;IADL;Coordination    Rehab Potential   Good    Clinical Decision Making  Limited treatment options, no task modification necessary    Comorbidities Affecting Occupational Performance:  None    Modification or Assistance to Complete Evaluation   No modification of tasks or assist necessary to complete eval    OT Frequency  2x / week    OT Duration  6 weeks    OT Treatment/Interventions  Self-care/ADL training;Patient/family education;Splinting;Therapeutic exercise;Paraffin;Fluidtherapy;Contrast Bath;Manual Therapy;Passive range of motion;Scar mobilization    Plan  Assess progress in HEP and upgrade as needed -add scar massage and cica scar pad to HEP    OT Home Exercise Plan  see pti instruction    Consulted and Agree with Plan of Care  Patient       Patient will benefit from skilled therapeutic intervention in order to improve the following deficits and impairments:   Body Structure / Function / Physical Skills: ADL, Flexibility, ROM, UE functional use, FMC, Scar mobility, Dexterity, Edema, Pain, IADL, Coordination       Visit Diagnosis: Stiffness of right hand, not elsewhere classified - Plan: Ot plan of care cert/re-cert  Pain in right hand - Plan: Ot plan of care cert/re-cert  Muscle weakness (generalized) - Plan: Ot plan of care cert/re-cert    Problem List Patient Active Problem List   Diagnosis Date Noted  . Hyperglycemia 10/16/2015  . Special screening for malignant neoplasms, colon   . Benign neoplasm of ascending colon   . Hyperlipidemia 06/29/2015  . Annual physical exam 06/20/2015  . Screening for colon cancer 06/20/2015  . Encounter to establish care with new doctor 05/23/2015    Rosalyn Gess OTR/L,CLT 02/23/2019, 11:07 AM  [facility] [address], [place], [address] Phone: [phone]   Fax:  [phone]  Name: Douglas Friedman MRN: [id number] Date of Birth: July 17, 1960

## 2019-02-23 NOTE — Patient Instructions (Signed)
Contrast  AROM for thumb PA and RA  Thumb circular motion Opposition to digits -  Tendon glides  Wrist flexion , ext, RD, UD  AROM for all and pain or pull less than 1-2/10  Splint on at all time when using hand or up and about  Can do ice at end of session

## 2019-03-01 ENCOUNTER — Ambulatory Visit: Payer: 59 | Admitting: Occupational Therapy

## 2019-03-08 ENCOUNTER — Other Ambulatory Visit: Payer: Self-pay

## 2019-03-08 ENCOUNTER — Ambulatory Visit: Payer: 59 | Admitting: Occupational Therapy

## 2019-03-08 DIAGNOSIS — M25641 Stiffness of right hand, not elsewhere classified: Secondary | ICD-10-CM | POA: Diagnosis not present

## 2019-03-08 DIAGNOSIS — M79641 Pain in right hand: Secondary | ICD-10-CM

## 2019-03-08 DIAGNOSIS — M6281 Muscle weakness (generalized): Secondary | ICD-10-CM

## 2019-03-08 NOTE — Therapy (Signed)
[facility] [address], [place], [address] Phone: [phone]   Fax:  [phone]  Occupational Therapy Treatment  Patient Details  Name: Douglas Friedman MRN: [id number] Date of Birth: Jun 14, 1960 Referring Provider (OT): Rudene Christians   Encounter Date: 03/08/2019  OT End of Session - 03/08/19 1209    Visit Number  2    Number of Visits  12    Date for OT Re-Evaluation  04/06/19    OT Start Time  1129    OT Stop Time  1159    OT Time Calculation (min)  30 min    Activity Tolerance  Patient tolerated treatment well    Behavior During Therapy  [facility] for tasks assessed/performed       Past Medical History:  Diagnosis Date  . Arthritis   . Hypercholesteremia     Past Surgical History:  Procedure Laterality Date  . CERVICAL FUSION  2005   x2  . COLONOSCOPY WITH PROPOFOL N/A 07/10/2015   Procedure: COLONOSCOPY WITH PROPOFOL;  Surgeon: Lucilla Lame, MD;  Location: [place];  Service: Endoscopy;  Laterality: N/A;  . FINGER ARTHROSCOPY WITH CARPOMETACARPEL ([place]) ARTHROPLASTY Right 02/04/2019   Procedure: FINGER ARTHROSCOPY WITH CARPOMETACARPEL [facility]) ARTHROPLASTY;  Surgeon: Hessie Knows, MD;  Location: [facility];  Service: Orthopedics;  Laterality: Right;  . LUMBAR MICRODISCECTOMY Bilateral 2000   L4 and L5  . POLYPECTOMY  07/10/2015   Procedure: POLYPECTOMY;  Surgeon: Lucilla Lame, MD;  Location: [place];  Service: Endoscopy;;  . SHOULDER SURGERY Right 2014   Torn Supraspinatus    There were no vitals filed for this visit.  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  Yes    Pain Score  1     Pain Location  Hand    Pain Orientation  Left    Pain Descriptors / Indicators  Aching    Pain Type  Surgical pain         [facility] OT Assessment - 03/08/19 0001      AROM   Right Wrist Extension  68 Degrees    Right Wrist Flexion  84 Degrees    Right Wrist Radial Deviation  20 Degrees    Right Wrist Ulnar Deviation  30 Degrees      Right Hand AROM   R Thumb MCP 0-60  55 Degrees    R Thumb IP 0-80  70 Degrees    R Thumb Radial ABduction/ADduction 0-55  60    R Thumb Palmar ABduction/ADduction 0-45  62     R Thumb Opposition to Index  --   Opposition to 2nd fold of 5th       Pt show increase AROM in thumb and wrist - see flowsheet  report no increase pain except when wearing thumb spica  - pt fitted with CMC  neoprene splint to use during day -without increase pain         OT Treatments/Exercises (OP) - 03/08/19 0001      Moist Heat Therapy   Number Minutes Moist Heat  6 Minutes    Moist Heat Location  Hand   prior to ther ex      Review scar massage - adhere proximal part  cica scar pad night time issued  And kinesiotape during day - pt ed on doing 100% pull and star   Same AROM for thumb and wrist  Isometric for thumb in all planes - done this date pain free and pt demo understanding  And 16 oz hammer for wrist in all planes- done and demo understanding   pain free 12 reps  2-3 x day  And increase to 2 sets in 2-3 days and 5-6 days 3 sets if pain free       OT Education - 03/08/19 1208    Education Details  progress and upgrade to [employer]) Educated  Patient    Methods  Explanation;Demonstration;Tactile cues;Verbal cues    Comprehension  Verbalized understanding;Returned demonstration;Verbal cues required       OT Short Term Goals - 02/23/19 1059      OT SHORT TERM GOAL #1   Title   Pt to be independent in HEP to decrease scar tissue, increase ROM and strength to wean out of splint    Baseline  no knowledge    Time  3    Period  Weeks    Status  New    Target Date  03/16/19        OT Long Term Goals - 02/23/19 1100      OT LONG TERM GOAL #1   Title  Pt to show R thumb and wrist AROM WNL with out pain to initiate use in ADL's without pain    Baseline  no using hand - thumb spica on    Time  2    Period  Weeks    Status  New    Target Date  03/09/19      OT LONG TERM GOAL #2   Title  Grip and prehension strength in R hand improve to [facility] and more than 60% compare to L to perform ADL;s and IADL's without pain or sypmtoms    Baseline  2 1/2 wks s/p    Time  6    Period  Weeks    Status  New    Target Date  04/06/19      OT LONG TERM GOAL #3   Title  Function score on PRWHE improve with more than 25 points    Baseline  PRWHE function score at eval 43/50    Time  6    Period  Weeks    Status  New    Target Date  04/06/19            Plan - 03/08/19 1210    Clinical Impression Statement  Pt is 4 1/2 wks s/p R CMC arthroplasty - pt report some pain with wearing thumb spica - was suppose to see surgeon today - but was cancelled - pt wean into CMC neoprene splint for support while advancing strength and ROM - funcional use - upgrade and add strengthening without increasing pain    OT Occupational Profile and History  Problem Focused Assessment - Including review of records relating to presenting problem    Occupational performance deficits (  Please refer to evaluation for details):  ADL's;IADL's;Work;Play;Leisure;Social Participation    Body Structure / Function / Physical Skills  ADL;Flexibility;ROM;UE functional use;FMC;Scar mobility;Dexterity;Edema;Pain;IADL;Coordination    Rehab Potential  Good    Clinical Decision Making  Limited treatment options, no task modification necessary    Comorbidities Affecting Occupational Performance:  None    Modification  or Assistance to Complete Evaluation   No modification of tasks or assist necessary to complete eval    OT Frequency  1x / week    OT Duration  4 weeks    OT Treatment/Interventions  Self-care/ADL training;Patient/family education;Splinting;Therapeutic exercise;Paraffin;Fluidtherapy;Contrast Bath;Manual Therapy;Passive range of motion;Scar mobilization    Plan  Assess progress in HEP and upgrade as needed HEP    OT Home Exercise Plan  see pti instruction    Consulted and Agree with Plan of Care  Patient       Patient will benefit from skilled therapeutic intervention in order to improve the following deficits and impairments:   Body Structure / Function / Physical Skills: ADL, Flexibility, ROM, UE functional use, FMC, Scar mobility, Dexterity, Edema, Pain, IADL, Coordination       Visit Diagnosis: Stiffness of right hand, not elsewhere classified  Pain in right hand  Muscle weakness (generalized)    Problem List Patient Active Problem List   Diagnosis Date Noted  . Hyperglycemia 10/16/2015  . Special screening for malignant neoplasms, colon   . Benign neoplasm of ascending colon   . Hyperlipidemia 06/29/2015  . Annual physical exam 06/20/2015  . Screening for colon cancer 06/20/2015  . Encounter to establish care with new doctor 05/23/2015    Rosalyn Gess OTR/L,CLT 03/08/2019, 12:13 PM  [place] [address] [place], [address] Phone: [phone]   Fax:  [phone]  Name: Douglas Friedman MRN: [id number] Date of Birth: 1961/01/07

## 2019-03-08 NOTE — Patient Instructions (Signed)
Review scar massage again ,  cica scar pad night time  And kinesiotape during day   Same AROM for thumb and wrist  Isometric for thumb in all planes And 16 oz hammer for wrist in all planes  pain free 12 reps  2-3 x day  And increase to 2 sets in 2-3 days and 5-6 days 3 sets if pain free

## 2019-03-10 ENCOUNTER — Encounter: Payer: Self-pay | Admitting: Orthopedic Surgery

## 2019-03-15 ENCOUNTER — Other Ambulatory Visit: Payer: Self-pay

## 2019-03-15 ENCOUNTER — Ambulatory Visit: Payer: 59 | Attending: Orthopedic Surgery | Admitting: Occupational Therapy

## 2019-03-15 DIAGNOSIS — M25641 Stiffness of right hand, not elsewhere classified: Secondary | ICD-10-CM | POA: Insufficient documentation

## 2019-03-15 DIAGNOSIS — M6281 Muscle weakness (generalized): Secondary | ICD-10-CM | POA: Diagnosis present

## 2019-03-15 DIAGNOSIS — M79641 Pain in right hand: Secondary | ICD-10-CM | POA: Diagnosis present

## 2019-03-15 NOTE — Therapy (Signed)
[facility] [address] [place], [address] Phone: [phone]   Fax:  [phone]  Occupational Therapy Treatment  Patient Details  Name: Douglas Friedman MRN: [id number] Date of Birth: [birth year] Referring Provider (OT): Rudene Christians   Encounter Date: 03/15/2019  OT End of Session - 03/15/19 1539    Visit Number  3    Number of Visits  12    Date for OT Re-Evaluation  04/06/19    OT Start Time  1442    OT Stop Time  1527    OT Time Calculation (min)  45 min    Activity Tolerance  Patient tolerated treatment well    Behavior During Therapy  [facility] for tasks assessed/performed       Past Medical History:  Diagnosis Date  . Arthritis   . Hypercholesteremia     Past Surgical History:  Procedure Laterality Date  . CERVICAL FUSION  2005   x2  . COLONOSCOPY WITH PROPOFOL N/A 07/10/2015   Procedure: COLONOSCOPY WITH PROPOFOL;  Surgeon: Lucilla Lame, MD;  Location: [place];  Service: Endoscopy;  Laterality: N/A;  . FINGER ARTHROSCOPY WITH CARPOMETACARPEL ([place]) ARTHROPLASTY Right 02/04/2019   Procedure: FINGER ARTHROSCOPY WITH CARPOMETACARPEL [facility]) ARTHROPLASTY;  Surgeon: Hessie Knows, MD;  Location: [facility];  Service: Orthopedics;  Laterality: Right;  . LUMBAR MICRODISCECTOMY Bilateral 2000   L4 and L5  . POLYPECTOMY  07/10/2015   Procedure: POLYPECTOMY;  Surgeon: Lucilla Lame, MD;  Location: [place];  Service: Endoscopy;;  . SHOULDER SURGERY Right 2014   Torn Supraspinatus    There were no vitals filed for this visit.  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  Yes    Pain Score  4     Pain Location  --   Thumb MP R   Pain Orientation  Right    Pain Descriptors / Indicators  Aching;Tender    Pain Type  Surgical pain    Pain Onset  More than a month ago         [facility] OT Assessment - 03/15/19 0001      Strength   Right Hand Grip (lbs)  64    Right Hand Lateral Pinch  8 lbs   pain   Right Hand 3 Point Pinch  2 lbs   pain   Left Hand Grip (lbs)  74    Left Hand Lateral Pinch  18 lbs    Left Hand 3 Point Pinch  14 lbs      Right Hand AROM   R Thumb Opposition to Index  --   Opposition to base of 5th      Making great progress in scar management - still adhere proximal and tender over dorsal hand - but improving - cont cica care scar pad  And  kinesiotape  AROM in wrist and thumb great progress  Measurements taken - see flowsheet          OT Treatments/Exercises (OP) - 03/15/19 0001      RUE Paraffin   Number Minutes Paraffin  8 Minutes    RUE Paraffin Location  Hand    Comments  prior to review of HEP - decrease pain and increase ROM        increase AROM and decrease stiffness and pain after paraffin - recommend for home use   Fabricate MP thumb splint to use with prehension strength to prevent middle phalanges of thumb to collapse -and to decrease pain    Rubber band for thumb PA and RA  12 reps  2 x day  Putty light blue - with MP block splint on for lat and 3 point grip-  Pain free 12-15 reps  And gripping 12 reps  Pain free  Cont with 16oz hammer for wrist in all planes  Cont scar massage  And AROM tendon glides , opposition and thumb        OT Education - 03/15/19 1539    Education Details  progress and upgrade to [employer]) Educated  Patient    Methods  Explanation;Demonstration;Tactile cues;Verbal cues    Comprehension  Verbalized  understanding;Returned demonstration;Verbal cues required       OT Short Term Goals - 02/23/19 1059      OT SHORT TERM GOAL #1   Title  Pt to be independent in HEP to decrease scar tissue, increase ROM and strength to wean out of splint    Baseline  no knowledge    Time  3    Period  Weeks    Status  New    Target Date  03/16/19        OT Long Term Goals - 02/23/19 1100      OT LONG TERM GOAL #1   Title  Pt to show R thumb and wrist AROM WNL with out pain to initiate use in ADL's without pain    Baseline  no using hand - thumb spica on    Time  2    Period  Weeks    Status  New    Target Date  03/09/19      OT LONG TERM GOAL #2   Title  Grip and prehension strength in R hand improve to [facility] and more than 60% compare to L to perform ADL;s and IADL's without pain or sypmtoms    Baseline  2 1/2 wks s/p    Time  6    Period  Weeks    Status  New    Target Date  04/06/19      OT LONG TERM GOAL #3   Title  Function score on PRWHE improve with more than 25 points    Baseline  PRWHE function score at eval 43/50    Time  6    Period  Weeks    Status  New    Target Date  04/06/19            Plan - 03/15/19 1539    Clinical Impression Statement  Pt is 5 1/2 wks s/p R CMC arthroplasty - pt making great progress in AROM and weaning out of thumb spica splint - but pain and weakness with prehension strenght - and resistance for thumb PA and RA - upgrade HEP - and fabricate MP block splint to position MP  in correct position for pain free strengthening    OT Occupational Profile and History  Problem Focused Assessment - Including review of records relating to presenting problem    Occupational performance deficits (Please refer to evaluation for details):  ADL's;IADL's;Work;Play;Leisure;Social Participation    Body Structure / Function / Physical Skills  ADL;Flexibility;ROM;UE functional use;FMC;Scar mobility;Dexterity;Edema;Pain;IADL;Coordination    Rehab Potential  Fair     Clinical Decision Making  Limited treatment options, no task modification necessary    Comorbidities Affecting Occupational Performance:  None    Modification or Assistance to Complete Evaluation   No modification of tasks or assist necessary to complete eval    OT Frequency  2x / week    OT Duration  4 weeks    OT Treatment/Interventions  Self-care/ADL training;Patient/family education;Splinting;Therapeutic exercise;Paraffin;Fluidtherapy;Contrast Bath;Manual Therapy;Passive range of motion;Scar mobilization    Plan  Assess progress in HEP and upgrade as needed HEP    OT Home Exercise Plan  see pti instruction    Consulted and Agree with Plan of Care  Patient       Patient will benefit from skilled therapeutic intervention in order to improve the following deficits and impairments:   Body Structure / Function / Physical Skills: ADL, Flexibility, ROM, UE functional use, FMC, Scar mobility, Dexterity, Edema, Pain, IADL, Coordination       Visit Diagnosis: Stiffness of right hand, not elsewhere classified  Pain in right hand  Muscle weakness (generalized)    Problem List Patient Active Problem List   Diagnosis Date Noted  . Hyperglycemia 10/16/2015  . Special screening for malignant neoplasms, colon   . Benign neoplasm of ascending colon   . Hyperlipidemia 06/29/2015  . Annual physical exam 06/20/2015  . Screening for colon cancer 06/20/2015  . Encounter to establish care with new doctor 05/23/2015    Rosalyn Gess OTR/l,CLT 03/15/2019, 3:42 PM  [facility] [address] [place], [address] Phone: [phone]   Fax:  [phone]  Name: Douglas Friedman MRN: [id number] Date of Birth: 02-25-1961

## 2019-03-15 NOTE — Patient Instructions (Signed)
Rubber band for thumb PA and RA  12 reps  2 x day  Putty light blue - with MP block splint on for lat and 3 point grip-  Pain free 12-15 reps  And gripping 12 reps  Pain free  Cont with 16oz hammer for wrist in all planes  Cont scar massage  And AROM tendon glides , opposition and thumb

## 2019-03-18 ENCOUNTER — Ambulatory Visit: Payer: 59 | Admitting: Occupational Therapy

## 2019-03-23 ENCOUNTER — Encounter: Payer: Self-pay | Admitting: Family Medicine

## 2019-03-23 ENCOUNTER — Ambulatory Visit (INDEPENDENT_AMBULATORY_CARE_PROVIDER_SITE_OTHER): Payer: 59 | Admitting: Family Medicine

## 2019-03-23 ENCOUNTER — Other Ambulatory Visit: Payer: Self-pay

## 2019-03-23 DIAGNOSIS — Z114 Encounter for screening for human immunodeficiency virus [HIV]: Secondary | ICD-10-CM

## 2019-03-23 DIAGNOSIS — Z1159 Encounter for screening for other viral diseases: Secondary | ICD-10-CM | POA: Diagnosis not present

## 2019-03-23 DIAGNOSIS — Z125 Encounter for screening for malignant neoplasm of prostate: Secondary | ICD-10-CM

## 2019-03-23 DIAGNOSIS — E7800 Pure hypercholesterolemia, unspecified: Secondary | ICD-10-CM

## 2019-03-23 DIAGNOSIS — R739 Hyperglycemia, unspecified: Secondary | ICD-10-CM | POA: Diagnosis not present

## 2019-03-23 NOTE — Progress Notes (Signed)
Name: Douglas Friedman   MRN: [id number]    DOB: 02-24-1961   Date:03/23/2019       Progress Note  Subjective  Chief Complaint  Chief Complaint  Patient presents with  . Establish Care    I connected with  [name] on 03/23/19 at 10:40 AM EST by telephone and verified that I am speaking with the correct person using two identifiers.  I discussed the limitations, risks, security and privacy concerns of performing an evaluation and management service by telephone and the availability of in person appointments. Staff also discussed with the patient that there may be a patient responsible charge related to this service. Patient Location: Home Provider Location: Office Additional Individuals present: None  HPI  HLD: Had elevated LDL of 134 back in March 2017.  Denies chest pain, shortness of breath.  Has been walking weather permitting, once hands are better will start lifting again.  Has been eating about 2 meals a day since being home - needs to get back on more regular routine.   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  Rehab has been going well with OT; seeing Dr. Rudene Christians.  History IFG: No polyuria, polyphagia, or polydipsia.  Working on diet, walking daily.  Patient Active Problem List   Diagnosis Date Noted  . Hyperglycemia 10/16/2015  . Special screening for malignant neoplasms, colon   . Benign neoplasm of ascending colon   . Hyperlipidemia 06/29/2015  . Annual physical exam 06/20/2015  . Screening for colon cancer 06/20/2015  . Encounter to establish care with new doctor 05/23/2015    Past Surgical History:  Procedure Laterality Date  . CERVICAL FUSION  2005   x2  . COLONOSCOPY WITH PROPOFOL N/A 07/10/2015   Procedure: COLONOSCOPY WITH PROPOFOL;  Surgeon: Lucilla Lame, MD;  Location: [place];  Service: Endoscopy;  Laterality: N/A;  . FINGER ARTHROSCOPY WITH CARPOMETACARPEL ([place])  ARTHROPLASTY Right 02/04/2019   Procedure: FINGER ARTHROSCOPY WITH CARPOMETACARPEL [facility]) ARTHROPLASTY;  Surgeon: Hessie Knows, MD;  Location: [facility];  Service: Orthopedics;  Laterality: Right;  . LUMBAR MICRODISCECTOMY Bilateral 2000   L4 and L5  . POLYPECTOMY  07/10/2015   Procedure: POLYPECTOMY;  Surgeon: Lucilla Lame, MD;  Location: [place];  Service: Endoscopy;;  . SHOULDER SURGERY Right 2014   Torn Supraspinatus    Family History  Problem Relation Age of Onset  . Diabetes Mother   . Diabetes Brother     Social History   Socioeconomic History  . Marital status: Married    Spouse name: Not on file  . Number of children: Not on file  . Years of education: Not on file  . Highest education level: Not on file  Occupational History  . Not on file  Tobacco Use  . Smoking status: Former Research scientist (life sciences)  . Smokeless tobacco: Never Used  . Tobacco comment: quit 1988  Substance and Sexual Activity  . Alcohol use: Not Currently    Alcohol/week: 6.0 standard drinks    Types: 6 Cans of beer per week    Comment: occasional,   . Drug use: No  . Sexual activity: Yes    Partners: Female  Other Topics Concern  . Not on file  Social History Narrative  . Not on file   Social Determinants of Health   Financial Resource Strain:   . Difficulty of Paying Living Expenses: Not on file  Food Insecurity:   . Worried  About Running Out of Food in the Last Year: Not on file  . Ran Out of Food in the Last Year: Not on file  Transportation Needs:   . Lack of Transportation (Medical): Not on file  . Lack of Transportation (Non-Medical): Not on file  Physical Activity:   . Days of Exercise per Week: Not on file  . Minutes of Exercise per Session: Not on file  Stress:   . Feeling of Stress : Not on file  Social Connections:   . Frequency of Communication with Friends and Family: Not on file  . Frequency of Social Gatherings with Friends and Family: Not on file  . Attends Religious  Services: Not on file  . Active Member of Clubs or Organizations: Not on file  . Attends Archivist Meetings: Not on file  . Marital Status: Not on file  Intimate Partner Violence:   . Fear of Current or Ex-Partner: Not on file  . Emotionally Abused: Not on file  . Physically Abused: Not on file  . Sexually Abused: Not on file     Current Outpatient Medications:  .  HAWTHORN BERRY PO, Take 1 tablet by mouth daily., Disp: , Rfl:  .  Multiple Vitamin (MULTIVITAMIN WITH MINERALS) TABS tablet, Take 1 tablet by mouth daily., Disp: , Rfl:  .  oxyCODONE-acetaminophen (PERCOCET) 5-325 MG tablet, Take 1-2 tablets by mouth every 6 (six) hours as needed for severe pain. (Patient not taking: Reported on 03/23/2019), Disp: 35 tablet, Rfl: 0  No Known Allergies  I personally reviewed active problem list, medication list, allergies, notes from last encounter, lab results with the patient/caregiver today.   ROS  Constitutional: Negative for fever or weight change.  Respiratory: Negative for cough and shortness of breath.   Cardiovascular: Negative for chest pain or palpitations.  Gastrointestinal: Negative for abdominal pain, no bowel changes.  Musculoskeletal: Negative for gait problem or joint swelling.  Skin: Negative for rash.  Neurological: Negative for dizziness or headache.  No other specific complaints in a complete review of systems (except as listed in HPI above).  Objective  Virtual encounter, vitals not obtained.  There is no height or weight on file to calculate BMI.  Physical Exam  Pulmonary/Chest: Effort normal. No respiratory distress. Speaking in complete sentences Neurological: Pt is alert and oriented to person, place, and time. Speech is normal Psychiatric: Patient has a normal mood and affect. behavior is normal. Judgment and thought content normal.  No results found for this or any previous visit (from the past 72 hour(s)).  PHQ2/9: Depression screen  [facility] 2/9 03/23/2019 10/16/2015 06/20/2015 05/23/2015  Decreased Interest 0 0 0 0  Down, Depressed, Hopeless 0 0 0 0  PHQ - 2 Score 0 0 0 0  Altered sleeping 0 - - -  Tired, decreased energy 0 - - -  Change in appetite 0 - - -  Feeling bad or failure about yourself  0 - - -  Trouble concentrating 0 - - -  Moving slowly or fidgety/restless 0 - - -  Suicidal thoughts 0 - - -  PHQ-9 Score 0 - - -  Difficult doing work/chores Not difficult at all - - -   PHQ-2/9 Result is negative.    Fall Risk: Fall Risk  03/23/2019 10/16/2015 06/20/2015 05/23/2015  Falls in the past year? 0 No No No  Number falls in past yr: 0 - - -  Injury with Fall? 0 - - -  Follow up Falls evaluation  completed - - -    Assessment & Plan  1. Pure hypercholesterolemia - Diet discussed - Lipid panel  2. Hyperglycemia - Diet discussed - COMPLETE METABOLIC PANEL WITH GFR  3. Screening for HIV without presence of risk factors - HIV Antibody (routine testing w rflx)  4. Encounter for hepatitis C screening test for low risk patient - Hepatitis C antibody  5. Prostate cancer screening - PSA  I discussed the assessment and treatment plan with the patient. The patient was provided an opportunity to ask questions and all were answered. The patient agreed with the plan and demonstrated an understanding of the instructions.   The patient was advised to call back or seek an in-person evaluation if the symptoms worsen or if the condition fails to improve as anticipated.  I provided 16 minutes of non-face-to-face time during this encounter.  Hubbard Hartshorn, FNP

## 2019-03-29 ENCOUNTER — Other Ambulatory Visit: Payer: Self-pay | Admitting: Orthopedic Surgery

## 2019-03-31 ENCOUNTER — Encounter
Admission: RE | Admit: 2019-03-31 | Discharge: 2019-03-31 | Disposition: A | Discharge status: Home / Self Care | Payer: 59 | Source: Ambulatory Visit | Attending: Orthopedic Surgery | Admitting: Orthopedic Surgery

## 2019-03-31 ENCOUNTER — Other Ambulatory Visit: Payer: Self-pay

## 2019-03-31 NOTE — Patient Instructions (Addendum)
Your procedure is scheduled on: Thursday, April 08, 2019 Report to Day Surgery on the 2nd floor of the [facility]. To find out your arrival time, please call [phone] between 1PM - 3PM on: Wednesday, April 07, 2019  REMEMBER: Instructions that are not followed completely may result in serious medical risk, up to and including death; or upon the discretion of your surgeon and anesthesiologist your surgery may need to be rescheduled.  Do not eat food after midnight the night before surgery.  No gum chewing, lozengers or hard candies.  You may however, drink CLEAR liquids up to 2 hours before you are scheduled to arrive for your surgery. Do not drink anything within 2 hours of the start of your surgery.  Clear liquids include: - water  - apple juice without pulp - gatorade - black coffee or tea (Do NOT add milk or creamers to the coffee or tea) Do NOT drink anything that is not on this list.  ENSURE PRE-SURGERY CARBOHYDRATE DRINK:  Complete drinking 3 hours prior to surgery.  No Alcohol for 24 hours before or after surgery.  No Smoking including e-cigarettes for 24 hours prior to surgery.  No chewable tobacco products for at least 6 hours prior to surgery.  No nicotine patches on the day of surgery.  On the morning of surgery brush your teeth with toothpaste and water, you may rinse your mouth with mouthwash if you wish. Do not swallow any toothpaste or mouthwash.  Notify your doctor if there is any change in your medical condition (cold, fever, infection).  Do not wear jewelry, make-up, hairpins, clips or nail polish.  Do not wear lotions, powders, or perfumes.   Do not shave 48 hours prior to surgery.   Do not bring valuables to the hospital, including drivers license, insurance or credit cards.  [facility] is not responsible for any belongings or valuables.   TAKE THESE MEDICATIONS THE MORNING OF SURGERY:  none  Use CHG Soap as directed on instruction  sheet.  Now!  Stop Anti-inflammatories (NSAIDS) such as Advil, Aleve, Ibuprofen, Motrin, Naproxen, Naprosyn and Aspirin based products such as Excedrin, Goodys Powder, BC Powder. (May take Tylenol or Acetaminophen if needed.)  Now!  Stop ANY OVER THE COUNTER supplements until after surgery.  Wear comfortable clothing (specific to your surgery type) to the hospital.  If you are being discharged the day of surgery, you will not be allowed to drive home. You will need a responsible adult to drive you home and stay with you that night.   If you are taking public transportation, you will need to have a responsible adult with you. Please confirm with your physician that it is acceptable to use public transportation.   Please call [phone] if you have any questions about these instructions.

## 2019-04-05 ENCOUNTER — Other Ambulatory Visit: Payer: Self-pay

## 2019-04-05 ENCOUNTER — Other Ambulatory Visit
Admission: RE | Admit: 2019-04-05 | Discharge: 2019-04-05 | Disposition: A | Discharge status: Home / Self Care | Payer: 59 | Source: Ambulatory Visit | Attending: Orthopedic Surgery | Admitting: Orthopedic Surgery

## 2019-04-05 DIAGNOSIS — Z20828 Contact with and (suspected) exposure to other viral communicable diseases: Secondary | ICD-10-CM | POA: Insufficient documentation

## 2019-04-05 DIAGNOSIS — Z01812 Encounter for preprocedural laboratory examination: Secondary | ICD-10-CM | POA: Diagnosis not present

## 2019-04-05 LAB — SARS CORONAVIRUS 2 (TAT 6-24 HRS): SARS Coronavirus 2: NEGATIVE

## 2019-04-07 MED ORDER — CEFAZOLIN SODIUM-DEXTROSE 2-4 GM/100ML-% IV SOLN
2.0000 g | INTRAVENOUS | Status: AC
  Administered 2019-04-08: 2 g via INTRAVENOUS

## 2019-04-08 ENCOUNTER — Ambulatory Visit
Admission: RE | Admit: 2019-04-08 | Discharge: 2019-04-08 | Disposition: A | Discharge status: Home / Self Care | Payer: 59 | Attending: Orthopedic Surgery | Admitting: Orthopedic Surgery

## 2019-04-08 ENCOUNTER — Ambulatory Visit: Payer: 59

## 2019-04-08 ENCOUNTER — Encounter
Admission: RE | Disposition: A | Discharge status: Home / Self Care | Payer: Self-pay | Source: Home / Self Care | Attending: Orthopedic Surgery

## 2019-04-08 ENCOUNTER — Encounter: Payer: Self-pay | Admitting: Orthopedic Surgery

## 2019-04-08 ENCOUNTER — Ambulatory Visit: Payer: 59 | Admitting: Anesthesiology

## 2019-04-08 ENCOUNTER — Other Ambulatory Visit: Payer: Self-pay

## 2019-04-08 DIAGNOSIS — G8918 Other acute postprocedural pain: Secondary | ICD-10-CM

## 2019-04-08 DIAGNOSIS — M180 Bilateral primary osteoarthritis of first carpometacarpal joints: Secondary | ICD-10-CM | POA: Insufficient documentation

## 2019-04-08 HISTORY — PX: CARPOMETACARPAL (CMC) FUSION OF THUMB: SHX6290

## 2019-04-08 SURGERY — CARPOMETACARPAL (CMC) FUSION OF THUMB
Anesthesia: General | Site: Thumb | Laterality: Left

## 2019-04-08 MED ORDER — OXYCODONE HCL 5 MG PO TABS
5.0000 mg | ORAL_TABLET | Freq: Once | ORAL | Status: AC | PRN
  Administered 2019-04-08: 5 mg via ORAL

## 2019-04-08 MED ORDER — PROPOFOL 10 MG/ML IV BOLUS
INTRAVENOUS | Status: AC
  Filled 2019-04-08: qty 40

## 2019-04-08 MED ORDER — FENTANYL CITRATE (PF) 100 MCG/2ML IJ SOLN
INTRAMUSCULAR | Status: AC
  Filled 2019-04-08: qty 2

## 2019-04-08 MED ORDER — OXYCODONE HCL 5 MG/5ML PO SOLN: 5.0000 mg | Freq: Once | ORAL | Status: AC | PRN

## 2019-04-08 MED ORDER — ONDANSETRON HCL 4 MG/2ML IJ SOLN
INTRAMUSCULAR | Status: AC
  Filled 2019-04-08: qty 2

## 2019-04-08 MED ORDER — FENTANYL CITRATE (PF) 100 MCG/2ML IJ SOLN
INTRAMUSCULAR | Status: AC
  Administered 2019-04-08: 12:00:00 25 ug via INTRAVENOUS
  Filled 2019-04-08: qty 2

## 2019-04-08 MED ORDER — DEXAMETHASONE SODIUM PHOSPHATE 10 MG/ML IJ SOLN
INTRAMUSCULAR | Status: AC
  Filled 2019-04-08: qty 1

## 2019-04-08 MED ORDER — SUGAMMADEX SODIUM 500 MG/5ML IV SOLN
INTRAVENOUS | Status: AC
  Filled 2019-04-08: qty 5

## 2019-04-08 MED ORDER — FENTANYL CITRATE (PF) 100 MCG/2ML IJ SOLN
INTRAMUSCULAR | Status: DC | PRN
  Administered 2019-04-08: 50 ug via INTRAVENOUS
  Administered 2019-04-08 (×2): 25 ug via INTRAVENOUS
  Administered 2019-04-08: 50 ug via INTRAVENOUS

## 2019-04-08 MED ORDER — NEOMYCIN-POLYMYXIN B GU 40-200000 IR SOLN
Status: DC | PRN
  Administered 2019-04-08: 2 mL

## 2019-04-08 MED ORDER — FAMOTIDINE 20 MG PO TABS
ORAL_TABLET | ORAL | Status: AC
  Administered 2019-04-08: 20 mg via ORAL
  Filled 2019-04-08: qty 1

## 2019-04-08 MED ORDER — LIDOCAINE HCL (CARDIAC) PF 100 MG/5ML IV SOSY
PREFILLED_SYRINGE | INTRAVENOUS | Status: DC | PRN
  Administered 2019-04-08: 100 mg via INTRAVENOUS

## 2019-04-08 MED ORDER — CHLORHEXIDINE GLUCONATE 4 % EX LIQD
60.0000 mL | Freq: Once | CUTANEOUS | Status: AC
  Administered 2019-04-08: 4 via TOPICAL

## 2019-04-08 MED ORDER — MIDAZOLAM HCL 2 MG/2ML IJ SOLN
INTRAMUSCULAR | Status: AC
  Filled 2019-04-08: qty 2

## 2019-04-08 MED ORDER — PROPOFOL 10 MG/ML IV BOLUS
INTRAVENOUS | Status: DC | PRN
  Administered 2019-04-08: 200 mg via INTRAVENOUS

## 2019-04-08 MED ORDER — FENTANYL CITRATE (PF) 100 MCG/2ML IJ SOLN: 25.0000 ug | INTRAMUSCULAR | Status: DC | PRN

## 2019-04-08 MED ORDER — ONDANSETRON HCL 4 MG/2ML IJ SOLN: 4.0000 mg | Freq: Four times a day (QID) | INTRAMUSCULAR | Status: DC | PRN

## 2019-04-08 MED ORDER — FAMOTIDINE 20 MG PO TABS: 20.0000 mg | ORAL_TABLET | Freq: Once | ORAL | Status: AC

## 2019-04-08 MED ORDER — SODIUM CHLORIDE 0.9 % IV SOLN: INTRAVENOUS | Status: DC

## 2019-04-08 MED ORDER — GELATIN ABSORBABLE 12-7 MM EX MISC
CUTANEOUS | Status: DC | PRN
  Administered 2019-04-08: 1

## 2019-04-08 MED ORDER — LACTATED RINGERS IV SOLN: INTRAVENOUS | Status: DC

## 2019-04-08 MED ORDER — METOCLOPRAMIDE HCL 5 MG/ML IJ SOLN: 5.0000 mg | Freq: Three times a day (TID) | INTRAMUSCULAR | Status: DC | PRN

## 2019-04-08 MED ORDER — EPHEDRINE SULFATE 50 MG/ML IJ SOLN
INTRAMUSCULAR | Status: DC | PRN
  Administered 2019-04-08: 10 mg via INTRAVENOUS

## 2019-04-08 MED ORDER — METOCLOPRAMIDE HCL 10 MG PO TABS: 5.0000 mg | ORAL_TABLET | Freq: Three times a day (TID) | ORAL | Status: DC | PRN

## 2019-04-08 MED ORDER — DEXAMETHASONE SODIUM PHOSPHATE 10 MG/ML IJ SOLN
INTRAMUSCULAR | Status: DC | PRN
  Administered 2019-04-08: 5 mg via INTRAVENOUS

## 2019-04-08 MED ORDER — ONDANSETRON HCL 4 MG PO TABS: 4.0000 mg | ORAL_TABLET | Freq: Four times a day (QID) | ORAL | Status: DC | PRN

## 2019-04-08 MED ORDER — OXYCODONE HCL 5 MG PO TABS
ORAL_TABLET | ORAL | Status: AC
  Filled 2019-04-08: qty 1

## 2019-04-08 MED ORDER — CEFAZOLIN SODIUM-DEXTROSE 2-4 GM/100ML-% IV SOLN
INTRAVENOUS | Status: AC
  Filled 2019-04-08: qty 100

## 2019-04-08 MED ORDER — BUPIVACAINE HCL (PF) 0.5 % IJ SOLN
INTRAMUSCULAR | Status: DC | PRN
  Administered 2019-04-08: 20 mL

## 2019-04-08 MED ORDER — ONDANSETRON HCL 4 MG/2ML IJ SOLN
INTRAMUSCULAR | Status: DC | PRN
  Administered 2019-04-08: 4 mg via INTRAVENOUS

## 2019-04-08 SURGICAL SUPPLY — 36 items
BLADE OSC/SAGITTAL 5.5X25 (BLADE) ×2 IMPLANT
BNDG CMPR STD VLCR NS LF 5.8X3 (GAUZE/BANDAGES/DRESSINGS) ×1
BNDG CONFORM 3 STRL LF (GAUZE/BANDAGES/DRESSINGS) ×2 IMPLANT
BNDG ELASTIC 3X5.8 VLCR NS LF (GAUZE/BANDAGES/DRESSINGS) ×2 IMPLANT
CAST PADDING 3X4FT ST 30246 (SOFTGOODS) ×1
COVER WAND RF STERILE (DRAPES) ×2 IMPLANT
CUFF TOURN SGL QUICK 18X4 (TOURNIQUET CUFF) ×1 IMPLANT
DRAPE FLUOR MINI C-ARM 54X84 (DRAPES) ×2 IMPLANT
ELECT CAUTERY BLADE 6.4 (BLADE) ×2 IMPLANT
ELECT CAUTERY NDL 2.0 MIC (NEEDLE) IMPLANT
ELECT CAUTERY NEEDLE 2.0 MIC (NEEDLE) ×2 IMPLANT
GAUZE SPONGE 4X4 12PLY STRL (GAUZE/BANDAGES/DRESSINGS) ×2 IMPLANT
GAUZE XEROFORM 1X8 LF (GAUZE/BANDAGES/DRESSINGS) ×2 IMPLANT
GLOVE SURG SYN 9.0  PF PI (GLOVE) ×1
GLOVE SURG SYN 9.0 PF PI (GLOVE) ×1 IMPLANT
GOWN SRG 2XL LVL 4 RGLN SLV (GOWNS) ×1 IMPLANT
GOWN STRL NON-REIN 2XL LVL4 (GOWNS) ×2
GOWN STRL REUS W/ TWL LRG LVL3 (GOWN DISPOSABLE) ×1 IMPLANT
GOWN STRL REUS W/TWL LRG LVL3 (GOWN DISPOSABLE) ×2
KIT TURNOVER KIT A (KITS) ×2 IMPLANT
NS IRRIG 500ML POUR BTL (IV SOLUTION) ×2 IMPLANT
PACK EXTREMITY ARMC (MISCELLANEOUS) ×2 IMPLANT
PAD CAST CTTN 3X4 STRL (SOFTGOODS) ×1 IMPLANT
PADDING CAST COTTON 3X4 STRL (SOFTGOODS) ×1
SCALPEL PROTECTED #15 DISP (BLADE) ×5 IMPLANT
SLEEVE PROTECTION STRL DISP (MISCELLANEOUS) ×1 IMPLANT
SPLINT CAST 1 STEP 3X12 (MISCELLANEOUS) ×2 IMPLANT
SPLINT WRIST M LT TX990308 (SOFTGOODS) ×1 IMPLANT
SUT ETHILON 4-0 (SUTURE) ×2
SUT ETHILON 4-0 FS2 18XMFL BLK (SUTURE) ×1
SUT VIC AB 0 CT2 27 (SUTURE) ×4 IMPLANT
SUT VIC AB 3-0 SH 27 (SUTURE) ×2
SUT VIC AB 3-0 SH 27X BRD (SUTURE) ×1 IMPLANT
SUTURE ETHLN 4-0 FS2 18XMF BLK (SUTURE) ×1 IMPLANT
SYSTEM IMPLANT TIGHTROPE MINI (Anchor) ×2 IMPLANT
WIRE Z .062 C-WIRE SPADE TIP (WIRE) ×4 IMPLANT

## 2019-04-08 NOTE — Anesthesia Preprocedure Evaluation (Signed)
Anesthesia Evaluation  Patient identified by MRN, date of birth, ID band Patient awake    Reviewed: Allergy & Precautions, H&P , NPO status , Patient's Chart, lab work & pertinent test results  History of Anesthesia Complications Negative for: history of anesthetic complications  Airway Mallampati: II  TM Distance: >3 FB Neck ROM: full    Dental  (+) Chipped   Pulmonary neg sleep apnea, neg COPD, Not current smoker, former smoker,           Cardiovascular (-) hypertension(-) Past MI and (-) CHF negative cardio ROS  (-) dysrhythmias (-) Valvular Problems/Murmurs     Neuro/Psych neg Seizures negative neurological ROS  negative psych ROS   GI/Hepatic negative GI ROS, Neg liver ROS, neg GERD  ,  Endo/Other  negative endocrine ROSneg diabetes  Renal/GU negative Renal ROS     Musculoskeletal  (+) Arthritis ,   Abdominal   Peds  Hematology negative hematology ROS (+)   Anesthesia Other Findings Past Medical History: No date: Arthritis No date: Hypercholesteremia  Past Surgical History: 2005: CERVICAL FUSION     Comment:  x2 07/10/2015: COLONOSCOPY WITH PROPOFOL; N/A     Comment:  Procedure: COLONOSCOPY WITH PROPOFOL;  Surgeon: Lucilla Lame, MD;  Location: [place];  Service:               Endoscopy;  Laterality: N/A; 02/04/2019: FINGER ARTHROSCOPY WITH CARPOMETACARPEL ([place])  ARTHROPLASTY; Right     Comment:  Procedure: FINGER ARTHROSCOPY WITH CARPOMETACARPEL [facility])              ARTHROPLASTY;  Surgeon: Hessie Knows, MD;  Location:               [facility];  Service: Orthopedics;  Laterality: Right; 2000: LUMBAR MICRODISCECTOMY; Bilateral     Comment:  L4 and L5 07/10/2015: POLYPECTOMY     Comment:  Procedure: POLYPECTOMY;  Surgeon: Lucilla Lame, MD;                Location: [place];  Service: Endoscopy;; 2014: SHOULDER SURGERY; Right     Comment:  Torn Supraspinatus     Reproductive/Obstetrics negative OB ROS                             Anesthesia Physical  Anesthesia Plan  ASA: II  Anesthesia Plan: General LMA   Post-op Pain Management:    Induction: Intravenous  PONV Risk Score and Plan: 2 and Dexamethasone and Ondansetron  Airway Management Planned: LMA  Additional Equipment:   Intra-op Plan:   Post-operative Plan: Extubation in OR  Informed Consent: I have reviewed the patients History and Physical, chart, labs and discussed the procedure including the risks, benefits and alternatives for the proposed anesthesia with the patient or authorized representative who has indicated his/her understanding and acceptance.     Dental Advisory Given  Plan Discussed with: Anesthesiologist, CRNA and Surgeon  Anesthesia Plan Comments: (Patient consented for risks of anesthesia including but not limited to:  - adverse reactions to medications - damage to teeth, lips or other oral mucosa - sore throat or hoarseness - Damage to heart, brain, lungs or loss of life  Patient voiced understanding.)        Anesthesia Quick Evaluation

## 2019-04-08 NOTE — Discharge Instructions (Signed)
Keep hand elevated to help with swelling. Ice as needed.   AMBULATORY SURGERY  DISCHARGE INSTRUCTIONS   1) The drugs that you were given will stay in your system until tomorrow so for the next 24 hours you should not:  A) Drive an automobile B) Make any legal decisions C) Drink any alcoholic beverage   2) You may resume regular meals tomorrow.  Today it is better to start with liquids and gradually work up to solid foods.  You may eat anything you prefer, but it is better to start with liquids, then soup and crackers, and gradually work up to solid foods.   3) Please notify your doctor immediately if you have any unusual bleeding, trouble breathing, redness and pain at the surgery site, drainage, fever, or pain not relieved by medication.    4) Additional Instructions:        Please contact your physician with any problems or Same Day Surgery at [phone], Monday through Friday 6 am to 4 pm, or [place] at [facility] number at [phone].  Acute Pain, Adult Acute pain is a type of sudden pain that may last for just a few days or for as long as six months. It is often related to an illness, injury, or medical procedure. Acute pain may be mild, moderate, or severe. Pain can make it hard for you to do your normal, daily activities. It can cause anxiety and lead to other problems if it is left untreated. Treatment depends on the cause and severity of your pain. Acute pain usually goes away once your injury has healed or you are no longer ill. Follow these instructions at home: Medicines   Take over-the-counter and prescription medicines only as told by your health care provider.  Take the lowest dose of medicine for the shortest amount of time needed to relieve the pain.  If you are taking prescription pain medicine: ? Do not stop taking the medicine suddenly. Talk to your health care provider about how and when to discontinue prescription medicine. ? Do not take  more pills than told by your health care provider even if your pain is severe. ? Do not take other over-the-counter pain medicines in addition to prescription pain medicine unless told by your health care provider. ? Ask your health care provider if the medicine requires you to avoid driving or using heavy machinery. ? Ask your health care provider if the medicine can cause constipation. You may need to take these actions to prevent or treat constipation:  Drink enough fluid to keep your urine pale yellow.  Eat foods that are high in fiber, such as beans, whole grains, and fresh fruits and vegetables.  Take over-the-counter or prescription medicines.  Limit foods that are high in fat and processed sugars, such as fried or sweet foods. Managing pain, stiffness, and swelling     If directed, put ice on the affected area. To do this:  Put ice in a plastic bag.  Place a towel between your skin and the bag.  Leave the ice on for 20 minutes, 2-3 times a day. If directed, apply heat to the affected area as often as told by your health care provider. Use the heat source that your health care provider recommends, such as a moist heat pack or a heating pad.  Place a towel between your skin and the heat source.  Leave the heat on for 20-30 minutes.  Remove the heat if your skin turns bright red. This  is especially important if you are unable to feel pain, heat, or cold. You may have a greater risk of getting burned.  Activity  Rest as told by your health care provider.  Return to your normal activities as told by your health care provider. Ask your health care provider what activities are safe for you. General instructions  Check your pain level as told by your health care provider.  Ask your health care provider if other strategies such as distraction, relaxation, or physical therapies can help your pain.  Keep all follow-up visits as told by your health care provider. This is  important. Contact a health care provider if:  Your pain is not controlled by medicine.  Your pain does not improve or gets worse.  You have side effects from pain medicines, such as vomiting or confusion. Get help right away if you:  Have severe pain.  Have trouble breathing.  Lose consciousness.  Have chest pain or pressure that lasts for more than a few minutes, or if you have other symptoms along with chest pain, including if you: ? Have pain or discomfort in one or both arms, your back, neck, jaw, or stomach. ? Have shortness of breath. ? Break out in a cold sweat. ? Feel nauseous. ? Become light-headed. These symptoms may represent a serious problem that is an emergency. Do not wait to see if the symptoms will go away. Get medical help right away. Call your local emergency services (911 in the [place] Do not drive yourself to the hospital. Summary  Acute pain may be mild, moderate, or severe. It usually goes away once your injury has healed or you are no longer ill.  Take over-the-counter and prescription medicines only as told by your health care provider.  Ask your health care provider if the medicine prescribed to you can cause constipation.  Contact a health care provider if your pain is not controlled by medicine. This information is not intended to replace advice given to you by your health care provider. Make sure you discuss any questions you have with your health care provider. Document Revised: 08/10/2018 Document Reviewed: 08/10/2018 Elsevier Patient Education  [place].   Pain medicine as previously directed.  Keep arm elevated.  Ice to the back of the wrist today and tomorrow.  Call office if you are having problems.  Okay to loosen Ace wrap but leave splint and cotton padding in place if the fingers swell.

## 2019-04-08 NOTE — Transfer of Care (Signed)
Immediate Anesthesia Transfer of Care Note  Patient: Douglas Friedman  Procedure(s) Performed: CARPOMETACARPAL [facility]) ARTHROPLASTY (Left Thumb)  Patient Location: PACU  Anesthesia Type:General  Level of Consciousness: awake, alert  and oriented  Airway & Oxygen Therapy: Patient Spontanous Breathing and Patient connected to face mask oxygen  Post-op Assessment: Report given to RN and Post -op Vital signs reviewed and stable  Post vital signs: Reviewed and stable  Last Vitals:  Vitals Value Taken Time  BP 134/86 04/08/19 1126  Temp    Pulse 88 04/08/19 1128  Resp 16 04/08/19 1128  SpO2 100 % 04/08/19 1128  Vitals shown include unvalidated device data.  Last Pain:  Vitals:   04/08/19 1126  TempSrc:   PainSc: (P) Asleep         Complications: No apparent anesthesia complications

## 2019-04-08 NOTE — Anesthesia Post-op Follow-up Note (Signed)
Anesthesia QCDR form completed.        

## 2019-04-08 NOTE — Op Note (Signed)
04/08/2019  11:24 AM  PATIENT:  Douglas Friedman  58 y.o. 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  PROCEDURE:  Procedure(s): CARPOMETACARPAL (CMC) ARTHROPLASTY (Left)  SURGEON: Laurene Footman, MD  ASSISTANTS: None  ANESTHESIA:   general  EBL:  Total I/O In: -  Out: 5 [Blood:5]  BLOOD ADMINISTERED:none  DRAINS: none   LOCAL MEDICATIONS USED:  MARCAINE     SPECIMEN:  No Specimen  DISPOSITION OF SPECIMEN:  N/A  COUNTS:  YES  TOURNIQUET:   Total Tourniquet Time Documented: Forearm (Left) - 53 minutes Total: Forearm (Left) - 53 minutes   IMPLANTS: Mini tight rope x1  DICTATION: .Dragon Dictation  patient was brought to the operating room and after adequate anesthesia was obtained the left arm was prepped and draped in the usual sterile fashion.  After patient identification and timeout procedures were completed tourniquet was raised to 250 mmHg.  Incision was made between the dorsal and volar skin centered over the [facility] joint.  Skin and subcutaneous tissue were divided preserving cutaneous nerves.  The tendons were split and the capsule opened to provide exposure of the joint the trapezium was then removed in pieces showing extensive degenerative changes the [facility] joint the scaphoid appeared essentially normal.  After getting adequate resection of the trapezium the guide was used and a small incision made at the base of the second metacarpal a wire passed between the base of the first into the proximal second metacarpal and the tight mini tight rope suture passed with the anchor fixed to the base of the first metacarpal.  The thumb was held in the appropriate position as this the suture was tightened to after metal piece on the opposite side was tied down to the base of the metacarpal and this gave [place] good position of the first base of the first metacarpal in line  with the other metacarpals the it appeared stable to pistoning under mini C arm.  This suture was then tied down and cut short with the wound then infiltrated with 20 cc of half percent Sensorcaine to aid in postop analgesia.  The capsule was then repaired using 0 Vicryl followed by 3- 0 Vicryl subcutaneously and a 4-0 nylon in simple erupted fashion for all skin incisions.  Xeroform 4 x 4's web roll and a thumb spica splint were applied and tourniquet let down prior to wound closure.  PLAN OF CARE: Discharge to home after PACU  PATIENT DISPOSITION:  PACU - hemodynamically stable.

## 2019-04-08 NOTE — Anesthesia Postprocedure Evaluation (Signed)
Anesthesia Post Note  Patient: Douglas Friedman  Procedure(s) Performed: CARPOMETACARPAL [facility]) ARTHROPLASTY (Left Thumb)  Patient location during evaluation: PACU Anesthesia Type: General Level of consciousness: awake and alert Pain management: pain level controlled Vital Signs Assessment: post-procedure vital signs reviewed and stable Respiratory status: spontaneous breathing, nonlabored ventilation, respiratory function stable and patient connected to nasal cannula oxygen Cardiovascular status: blood pressure returned to baseline and stable Postop Assessment: no apparent nausea or vomiting Anesthetic complications: no     Last Vitals:  Vitals:   04/08/19 1205 04/08/19 1216  BP: 133/88 (!) 143/80  Pulse: 84 91  Resp: 17 18  Temp: 36.4 C (!) 36.2 C  SpO2: 94% 94%    Last Pain:  Vitals:   04/08/19 1216  TempSrc: Temporal  PainSc: 2                  [name]

## 2019-04-08 NOTE — H&P (Signed)
Reviewed paper H+P, will be scanned into chart. No changes noted.  

## 2019-04-08 NOTE — Anesthesia Procedure Notes (Signed)
Procedure Name: LMA Insertion Performed by: Fredderick Phenix, CRNA Pre-anesthesia Checklist: Patient identified, Emergency Drugs available, Suction available and Patient being monitored Patient Re-evaluated:Patient Re-evaluated prior to induction Oxygen Delivery Method: Circle system utilized Preoxygenation: Pre-oxygenation with 100% oxygen Induction Type: IV induction Ventilation: Mask ventilation without difficulty LMA: LMA inserted LMA Size: 4.5 Tube type: Oral Number of attempts: 1 Airway Equipment and Method: Oral airway Placement Confirmation: positive ETCO2 and breath sounds checked- equal and bilateral Tube secured with: Tape Dental Injury: Teeth and Oropharynx as per pre-operative assessment

## 2019-04-16 NOTE — H&P (Signed)
Chief Complaint  Patient presents with  . Follow-up  S/P Rt CMC Arthroplasty   History of the Present Illness: Douglas Friedman is a 59 y.o. male here for evaluation and discussion of a left thumb CMC arthroplasty. The patient has history of a right thumb CMC arthroplasty, done on 02/04/2019. He had previously discussed wanting surgery before the end of the year. He states his right thumb is stable, with only a small amount of pain in physical therapy; otherwise, he is able to do his activities of daily living. He has been working with [employer], OTR/L, CLT in physical therapy.   The patient is employed at [employer] working on [employer].   I have reviewed past medical, surgical, social and family history, and allergies as documented in the EMR.  Past Medical History: History reviewed. No pertinent past medical history.  Past Surgical History: Past Surgical History:  Procedure Laterality Date  . [facility] Arthroplasty Right 02/04/2019   Past Family History: History reviewed. No pertinent family history.  Medications: Current Outpatient Medications Ordered in Epic  Medication Sig Dispense Refill  . oxyCODONE-acetaminophen (PERCOCET) 5-325 mg tablet Take 1-2 tablets by mouth every 6 (six) hours as needed  . HAWTHORNE ORAL Take by mouth once daily  . multivitamin capsule Take by mouth   No current Epic-ordered facility-administered medications on file.   Allergies: No Known Allergies   Body mass index is 33.86 kg/m.  Review of Systems: A comprehensive 14 point ROS was performed, reviewed, and the pertinent orthopaedic findings are documented in the HPI.  Vitals:  03/22/19 1007  BP: 142/76   General Physical Examination:  General/Constitutional: No apparent distress: well-nourished and well developed. Eyes: Pupils equal, round with synchronous movement. Lungs: Clear to auscultation HEENT: Normal Vascular: No edema, swelling or tenderness, except as noted in  detailed exam. Cardiac: Heart rate and rhythm is regular. Integumentary: No impressive skin lesions present, except as noted in detailed exam. Neuro/Psych: Normal mood and affect, oriented to person, place and time.  Musculoskeletal Examination: On exam, the patient has good left hand grip strength. Tenderness at the base of the right thumb. Lungs are clear. Heart rate and rhythm is normal. HEENT is normal.   Radiographs: No new imaging studies were obtained or reviewed today.  Assessment: No diagnosis found.  Plan: The patient has clinical findings of stable right thumb arthroplasty, and left thumb CMC osteoarthritis.   The patient and I discussed left thumb arthroplasty. The patient has failed nonoperative measures, including bracing and injections in the past. He is happy with his right thumb CMC arthroplasty. I wrote a work note for the patient.   We will get him scheduled for left thumb CMC arthroplasty this year.   Surgical Risks:  The nature of the condition and the proposed procedure has been reviewed in detail with the patient. Surgical versus non-surgical options and prognosis for recovery have been reviewed and the inherent risks and benefits of each have been discussed including the risks of infection, bleeding, injury to nerves / blood vessels / tendons, incomplete relief of symptoms, persisting pain and / or stiffness, loss of function, complex regional pain syndrome, failure of procedure, as appropriate.  Teeth: Natural  Scribe Attestation: I, Dawn Royse, am acting as scribe for [employer], MD    Electronically signed by Lauris Poag, MD at 03/23/2019 6:47 PM EST

## 2019-05-04 ENCOUNTER — Other Ambulatory Visit: Payer: Self-pay

## 2019-05-04 ENCOUNTER — Encounter: Payer: Self-pay | Admitting: Occupational Therapy

## 2019-05-04 ENCOUNTER — Ambulatory Visit: Payer: 59 | Attending: Orthopedic Surgery | Admitting: Occupational Therapy

## 2019-05-04 DIAGNOSIS — M79642 Pain in left hand: Secondary | ICD-10-CM | POA: Diagnosis present

## 2019-05-04 DIAGNOSIS — M6281 Muscle weakness (generalized): Secondary | ICD-10-CM | POA: Diagnosis present

## 2019-05-04 DIAGNOSIS — M25642 Stiffness of left hand, not elsewhere classified: Secondary | ICD-10-CM | POA: Insufficient documentation

## 2019-05-04 NOTE — Therapy (Signed)
[facility] [address] [place], [address] Phone: [phone]   Fax:  [phone]  Occupational Therapy Evaluation  Patient Details  Name: Douglas Friedman MRN: [id number] Date of Birth: Feb 04, 1961 Referring Provider (OT): Rudene Christians   Encounter Date: 05/04/2019  OT End of Session - 05/04/19 1624    Visit Number  1    Number of Visits  12    Date for OT Re-Evaluation  07/06/19    OT Start Time  0800    OT Stop Time  0846    OT Time Calculation (min)  46 min       Past Medical History:  Diagnosis Date  . Arthritis   . Hypercholesteremia     Past Surgical History:  Procedure Laterality Date  . CARPOMETACARPAL ([place]) FUSION OF THUMB Left 04/08/2019   Procedure: CARPOMETACARPAL [facility]) ARTHROPLASTY;  Surgeon: Hessie Knows, MD;  Location: [facility];  Service: Orthopedics;  Laterality: Left;  . CERVICAL FUSION  2005   x2  . COLONOSCOPY WITH PROPOFOL N/A 07/10/2015   Procedure: COLONOSCOPY WITH PROPOFOL;  Surgeon: Lucilla Lame, MD;  Location: [place];  Service: Endoscopy;  Laterality: N/A;  . FINGER ARTHROSCOPY WITH CARPOMETACARPEL ([place]) ARTHROPLASTY Right 02/04/2019   Procedure: FINGER ARTHROSCOPY WITH CARPOMETACARPEL [facility]) ARTHROPLASTY;  Surgeon: Hessie Knows, MD;  Location: [facility];  Service: Orthopedics;  Laterality: Right;  . LUMBAR MICRODISCECTOMY Bilateral 2000   L4 and L5  . POLYPECTOMY  07/10/2015   Procedure: POLYPECTOMY;  Surgeon: Lucilla Lame, MD;  Location: [place];  Service: Endoscopy;;  . SHOULDER SURGERY Right 2014   Torn Supraspinatus    There were no vitals filed for this visit.  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  Yes    Pain Score  5     Pain Location  Hand    Pain Orientation  Left    Pain Descriptors / Indicators  Aching;Tender    Pain Type  Surgical pain    Pain Onset  More than a month ago    Pain Frequency  Constant    Aggravating Factors   AROM        [facility] OT Assessment - 05/04/19 0001      Assessment   Medical Diagnosis  L CMC arthroplasty    Referring Provider (OT)  Rudene Christians    Onset Date/Surgical Date  --   04/08/2019   Hand Dominance  Right    Next MD Visit  --   March   Prior Therapy  --   R thumb seen last year      Precautions   Required Braces or Orthoses  --   Thumb spica      Home  Environment   Lives With  Spouse      Prior Function   Vocation  Full time employment    Leisure  Building jet engins, work  on cars, play banjo little , very active      AROM   Right Wrist Extension  74 Degrees    Right Wrist Flexion  48 Degrees    Right Wrist Radial Deviation  11 Degrees    Right Wrist Ulnar Deviation  30 Degrees      Strength   Right Hand Grip (lbs)  70    Right Hand Lateral Pinch  17 lbs    Right Hand 3 Point Pinch  14 lbs      Left Hand AROM   L Thumb Radial ADduction/ABduction 0-55  55    L Thumb Palmar ADduction/ABduction 0-45  65    L Thumb Opposition to Index  --   Opposition to 5th - pain 4-5/10   L Index  MCP 0-90  85 Degrees    L Index PIP 0-100  90 Degrees    L Long  MCP 0-90  85 Degrees    L Long PIP 0-100  90 Degrees    L Ring  MCP 0-90  90 Degrees    L Ring PIP 0-100  100 Degrees    L Little  MCP 0-90  90 Degrees    L Little PIP 0-100  100 Degrees               OT Treatments/Exercises (OP) - 05/04/19 0001      LUE Contrast Bath   Time  9 minutes    Comments  prior to review of HEP - decrease pain        Contrast  2-3 x day  Pain free  AROM for thumb and wrist AROM tendon glides - only AROM - not force full AROM for thumb - stop when feeling pull Opposition to only 3rd or 4th - if pain free   ten can cont to 4th and 5th    AROM and AAROM for wrist pain free - stop before pain  Keep pain under 1-2/10  Wear thumb spica night time -and during day still -       OT Education - 05/04/19 1623    Education Details  findings of eval and HEP    Person(s) Educated  Patient    Methods  Explanation;Demonstration;Tactile cues;Verbal cues    Comprehension  Verbalized understanding;Returned demonstration;Verbal cues required       OT Short Term Goals - 05/04/19 1628      OT SHORT TERM GOAL #1   Title  Pt to be independent in HEP to decrease scar tissue, increase ROM and strength to wean out of splint    Baseline  sleeping not in splint - but soft one and pain at rest 5/10    Time  3    Period  Weeks    Status  New    Target Date  05/25/19        OT Long Term Goals - 05/04/19 1628      OT LONG TERM GOAL #1   Title  Pt to show L thumb and wrist AROM WNL with out pain to initiate use in ADL's without pain    Baseline  not using hand - thumb spica on most all the time    Time  3    Period  Weeks    Status  New    Target Date  05/25/19      OT LONG TERM GOAL #2   Title  Grip and prehension strength in L hand improve to [facility] and more than  60% compare to R to perform ADL;s and IADL's without pain or sypmtoms    Baseline  3 1/2 wks s/p    Time  6    Period  Weeks    Status  New    Target Date  06/15/19      OT LONG TERM GOAL #3   Title  Function score on PRWHE improve with more than 30 points    Baseline  PRWHE function score at eval 46/50    Time  9    Period  Weeks    Status  New    Target Date  07/06/19            Plan - 05/04/19 1625    Clinical Impression Statement  Pt is 3 1/2 wks s/p L CMCarthroplasty - pt arrive with pain 5/10 - and report this one is doing worse than the R one was doing  end  of last year - reinforce for pt importance to do painfree AROM for thumb and wrist not force - use contrast and splint to decrease pain -and stop when feeling pull about 1-2/10  - decrease AROM and strength in L hand and wrist - limiting his functional use of L hand and wrist  in ADLs and IADL;s    OT Occupational Profile and History  Problem Focused Assessment - Including review of records relating to presenting problem    Occupational performance deficits (Please refer to evaluation for details):  ADL's;IADL's;Work;Play;Leisure;Social Participation    Body Structure / Function / Physical Skills  ADL;Flexibility;ROM;UE functional use;FMC;Scar mobility;Dexterity;Edema;Pain;IADL;Coordination    Rehab Potential  Good    Clinical Decision Making  Limited treatment options, no task modification necessary    Comorbidities Affecting Occupational Performance:  None    Modification or Assistance to Complete Evaluation   No modification of tasks or assist necessary to complete eval    OT Frequency  --   1-2 x wk   OT Duration  --   9 wks   OT Treatment/Interventions  Self-care/ADL training;Patient/family education;Splinting;Therapeutic exercise;Paraffin;Fluidtherapy;Contrast Bath;Manual Therapy;Passive range of motion;Scar mobilization    Plan  assess progress with HEP - if pain decrease    OT Home Exercise Plan  see pt instruction    Consulted and Agree with Plan of Care  Patient       Patient will benefit from skilled therapeutic intervention in order to improve the following deficits and impairments:   Body Structure / Function / Physical Skills: ADL, Flexibility, ROM, UE functional use, FMC, Scar mobility, Dexterity, Edema, Pain, IADL, Coordination       Visit Diagnosis: Pain in left hand - Plan: Ot plan of care cert/re-cert  Stiffness of left hand, not elsewhere classified - Plan: Ot plan of care cert/re-cert  Muscle weakness (generalized) - Plan: Ot plan of care cert/re-cert    Problem  List Patient Active Problem List   Diagnosis Date Noted  . Hyperglycemia 10/16/2015  . Benign neoplasm of ascending colon   . Hyperlipidemia 06/29/2015    Rosalyn Gess OTR/L,CLT 05/04/2019, 4:33 PM  [facility] [address], [place], [address] Phone: [phone]   Fax:  [phone]  Name: Douglas Friedman MRN: [id number] Date of Birth: 01-11-1961

## 2019-05-04 NOTE — Patient Instructions (Signed)
Contrast  2-3 x day  Pain free AROM for thumb and wrist AROM tendon glides - only AROM - not force full AROM for thumb - stop when feeling pull Opposition to only 3rd or 4th - if pain free   ten can cont to 4th and 5th    AROM and AAROM for wrist pain free - stop before pain  Keep pain under 1-2/10  Wear thumb spica night time -and during day still -

## 2019-05-10 ENCOUNTER — Ambulatory Visit: Payer: 59 | Admitting: Occupational Therapy

## 2019-05-17 ENCOUNTER — Other Ambulatory Visit: Payer: Self-pay

## 2019-05-17 ENCOUNTER — Ambulatory Visit: Payer: 59 | Attending: Orthopedic Surgery | Admitting: Occupational Therapy

## 2019-05-17 DIAGNOSIS — M25642 Stiffness of left hand, not elsewhere classified: Secondary | ICD-10-CM | POA: Diagnosis present

## 2019-05-17 DIAGNOSIS — M79641 Pain in right hand: Secondary | ICD-10-CM

## 2019-05-17 DIAGNOSIS — M25641 Stiffness of right hand, not elsewhere classified: Secondary | ICD-10-CM | POA: Diagnosis present

## 2019-05-17 DIAGNOSIS — M6281 Muscle weakness (generalized): Secondary | ICD-10-CM | POA: Diagnosis present

## 2019-05-17 DIAGNOSIS — M79642 Pain in left hand: Secondary | ICD-10-CM | POA: Diagnosis not present

## 2019-05-17 NOTE — Therapy (Signed)
[place] [facility] [address] [place], [address] Phone: [phone]   Fax:  [phone]  Occupational Therapy Treatment  Patient Details  Name: Douglas Friedman MRN: [id number] Date of Birth: 1960-07-29 Referring Provider (OT): Rudene Christians   Encounter Date: 05/17/2019  OT End of Session - 05/17/19 0830    Visit Number  2    Number of Visits  12    Date for OT Re-Evaluation  07/06/19    OT Start Time  0831    OT Stop Time  0909    OT Time Calculation (min)  38 min    Activity Tolerance  Patient tolerated treatment well    Behavior During Therapy  [facility] for tasks assessed/performed       Past Medical History:  Diagnosis Date  . Arthritis   . Hypercholesteremia     Past Surgical History:  Procedure Laterality Date  . CARPOMETACARPAL ([place]) FUSION OF THUMB Left 04/08/2019   Procedure: CARPOMETACARPAL [facility]) ARTHROPLASTY;  Surgeon: Hessie Knows, MD;  Location: [facility];  Service: Orthopedics;  Laterality: Left;  . CERVICAL FUSION  2005   x2  . COLONOSCOPY WITH PROPOFOL N/A 07/10/2015   Procedure: COLONOSCOPY WITH PROPOFOL;  Surgeon: Lucilla Lame, MD;  Location: [place];  Service: Endoscopy;  Laterality: N/A;  . FINGER ARTHROSCOPY WITH CARPOMETACARPEL ([place]) ARTHROPLASTY Right 02/04/2019   Procedure: FINGER ARTHROSCOPY WITH CARPOMETACARPEL [facility]) ARTHROPLASTY;  Surgeon: Hessie Knows, MD;  Location: [facility];  Service: Orthopedics;  Laterality: Right;  . LUMBAR MICRODISCECTOMY Bilateral 2000   L4 and L5  . POLYPECTOMY  07/10/2015   Procedure: POLYPECTOMY;  Surgeon: Lucilla Lame, MD;  Location: [place];  Service: Endoscopy;;  . SHOULDER SURGERY Right 2014   Torn Supraspinatus    There were no vitals filed for this visit.  Subjective Assessment - 05/17/19 0831    Subjective   My pain and swelling is bad - and my scab is not healing - had blister last week - but I can do this with my thumb , slide down  to pinkie but with pain - but bringing in my thumb really hurts    Pertinent History  L thumb CMC arthroplasty was done 03/29/2019 - I had my R 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  Yes    Pain Score  5     Pain Location  Hand    Pain Orientation  Left    Pain Descriptors / Indicators  Aching;Tender;Burning    Pain Type  Surgical pain;Acute pain    Pain Onset  More than a month ago    Pain Frequency  Constant    Aggravating Factors   AROM         [facility] OT Assessment - 05/17/19 0001      AROM   Left Wrist Extension  70 Degrees    Left Wrist Flexion  88 Degrees    Left Wrist Radial Deviation  30 Degrees    Left Wrist Ulnar Deviation  18 Degrees      Left Hand AROM   L Thumb Radial ADduction/ABduction 0-55  58    L Thumb Palmar ADduction/ABduction 0-45  70    L Thumb Opposition to Index  --   Opposition to 5th  pain increase 5/10     Pt arrive with great AROM of thumb - but pain increase to 5/10 and resting pain 4/10  Edema and redness over incision and still scab -Reinforce for pt he cannot compare L hand surgery to R one  And not to over  Do AROM - more is not better  AROM assess  Pt tender and cannot wear or tolerate any padding over incision because of edema in splint  Teciderma applied to wear under splint night time and when out and about  When sitting take splint off and let it dry          OT Treatments/Exercises (OP) - 05/17/19 0001      LUE Contrast Bath   Time  9 minutes    Comments  to decrease pain and swelling       do contrast up to 5 x day - to decrease pain and edema And need to be 12-24 hrs pain free or less than 1-2/10 then can start back PAIN FREE AROM for thumb  Pt pain decrease to 0/10 in session - applied teciderma and tubigrip E -and applied splint - felt great per pt and no pain  Pt  to see surgeon Friday        OT Education - 05/17/19 0830    Education Details  HEP to decrease pain and edema    Person(s) Educated  Patient    Methods  Explanation;Demonstration;Tactile cues;Verbal cues    Comprehension  Verbalized understanding;Returned demonstration;Verbal cues required       OT Short Term Goals - 05/04/19 1628      OT SHORT TERM GOAL #1   Title  Pt to be independent in HEP to decrease scar tissue, increase ROM and strength to wean out of splint    Baseline  sleeping not in splint - but soft one and pain at rest 5/10    Time  3    Period  Weeks    Status  New    Target Date  05/25/19        OT Long Term Goals - 05/04/19 1628      OT LONG TERM GOAL #1   Title  Pt to show L thumb and wrist AROM WNL with out pain to initiate use in ADL's without pain    Baseline  not using hand - thumb spica on most all the time    Time  3    Period  Weeks    Status  New    Target Date  05/25/19      OT LONG TERM GOAL #2   Title  Grip and prehension strength in L hand improve to [facility] and more than 60% compare to R to perform ADL;s and IADL's without pain or sypmtoms    Baseline  3 1/2 wks s/p    Time  6    Period  Weeks    Status  New    Target Date  06/15/19      OT LONG TERM GOAL #3   Title  Function score on PRWHE improve with more than 30 points    Baseline  PRWHE function score at eval 46/50    Time  9    Period  Weeks    Status  New    Target Date  07/06/19            Plan - 05/17/19 0831    Clinical Impression Statement  Pt cancelled last week -was  seen 2 wks ago pt arrive with scab on incision and still read - from rubbing of splint per pt - and pain and edema at rest 4-5/10 - reinforce again with pt not to push AROM -and that need to get resting pain and edema to lessthan 1-2/10 pain - before can start back AROM - his AROM is great for 5 1/2 wks - pushing to hard - priority this week to decrease pain and edema for 48hrs -and if pain at rest  1-2/10 pain can start back AROM - pt to see surgeon Friday    OT Occupational Profile and History  Problem Focused Assessment - Including review of records relating to presenting problem    Occupational performance deficits (Please refer to evaluation for details):  ADL's;IADL's;Work;Play;Leisure;Social Participation    Body Structure / Function / Physical Skills  ADL;Flexibility;ROM;UE functional use;FMC;Scar mobility;Dexterity;Edema;Pain;IADL;Coordination    Rehab Potential  Good    Clinical Decision Making  Limited treatment options, no task modification necessary    Comorbidities Affecting Occupational Performance:  None    Modification or Assistance to Complete Evaluation   No modification of tasks or assist necessary to complete eval    OT Frequency  --   1-2 x wk   OT Duration  8 weeks    OT Treatment/Interventions  Self-care/ADL training;Patient/family education;Splinting;Therapeutic exercise;Paraffin;Fluidtherapy;Contrast Bath;Manual Therapy;Passive range of motion;Scar mobilization    Plan  assess progress with HEP - if pain and edema decrease    OT Home Exercise Plan  see pt instruction    Consulted and Agree with Plan of Care  Patient       Patient will benefit from skilled therapeutic intervention in order to improve the following deficits and impairments:   Body Structure / Function / Physical Skills: ADL, Flexibility, ROM, UE functional use, FMC, Scar mobility, Dexterity, Edema, Pain, IADL, Coordination       Visit Diagnosis: Pain in left hand  Stiffness of left hand, not elsewhere classified  Muscle weakness (generalized)  Stiffness of right hand, not elsewhere classified  Pain in right hand    Problem List Patient Active Problem List   Diagnosis Date Noted  . Hyperglycemia 10/16/2015  . Benign neoplasm of ascending colon   . Hyperlipidemia 06/29/2015    Rosalyn Gess  OTR/L,CLT  05/17/2019, 9:15 AM  [place]  [facility] [address] [place], [address] Phone: [phone]   Fax:  [phone]  Name: Douglas Friedman MRN: [id number] Date of Birth: 1960-10-14

## 2019-05-17 NOTE — Patient Instructions (Signed)
Pt to do 48 hrs contrast - 5 x day  Priority to decrease pain and edema And no ROM - until 12-24 hrs pain less than 1-2/10 at rest And then keep pain under 1-2/10 with AROM -  Splint night time and when out and about  And provided teciderma to wear in splint -cannot tolerate any bandaid - to thick and splint shave incision

## 2019-05-25 ENCOUNTER — Ambulatory Visit: Payer: 59 | Admitting: Occupational Therapy

## 2019-05-25 ENCOUNTER — Other Ambulatory Visit: Payer: Self-pay

## 2019-05-25 DIAGNOSIS — M79642 Pain in left hand: Secondary | ICD-10-CM

## 2019-05-25 DIAGNOSIS — M79641 Pain in right hand: Secondary | ICD-10-CM

## 2019-05-25 DIAGNOSIS — M25641 Stiffness of right hand, not elsewhere classified: Secondary | ICD-10-CM

## 2019-05-25 DIAGNOSIS — M6281 Muscle weakness (generalized): Secondary | ICD-10-CM

## 2019-05-25 DIAGNOSIS — M25642 Stiffness of left hand, not elsewhere classified: Secondary | ICD-10-CM

## 2019-05-25 NOTE — Patient Instructions (Signed)
REINFORCE AGAIN - painfree AROM with pt - keep pain under 2/10 - slight pull  Pain free for 3 days prior to advancement  And only do HEP 3 x day - not constant do AROM to thumb   BLock AROM to IP and MP of thumb 10x PA and RA of thumb - pain free 10x Opposition pick up 1cm foam block - 5 reps   Pain free in 3 days -and less than 1-2/10 with AROM  Can do 2 sets  But only 3 x day  Can do light coban wrap to decrease edema -and prior to put on [facility] neoprene splint   Cont contrast prior to HEP  Can use some Epson salt for edema

## 2019-05-25 NOTE — Therapy (Signed)
[place] [facility] [address], [place], [address] Phone: [phone]   Fax:  [phone]  Occupational Therapy Treatment  Patient Details  Name: Douglas Friedman MRN: [id number] Date of Birth: May 29, 1960 Referring Provider (OT): Rudene Christians   Encounter Date: 05/25/2019  OT End of Session - 05/25/19 [id number]    Visit Number  3    Number of Visits  12    Date for OT Re-Evaluation  07/06/19    OT Start Time  1135    OT Stop Time  1224    OT Time Calculation (min)  49 min    Activity Tolerance  Patient tolerated treatment well    Behavior During Therapy  [facility] for tasks assessed/performed       Past Medical History:  Diagnosis Date  . Arthritis   . Hypercholesteremia     Past Surgical History:  Procedure Laterality Date  . CARPOMETACARPAL ([place]) FUSION OF THUMB Left 04/08/2019   Procedure: CARPOMETACARPAL [facility]) ARTHROPLASTY;  Surgeon: Hessie Knows, MD;  Location: [facility];  Service: Orthopedics;  Laterality: Left;  . CERVICAL FUSION  2005   x2  . COLONOSCOPY WITH PROPOFOL N/A 07/10/2015   Procedure: COLONOSCOPY WITH PROPOFOL;  Surgeon: Lucilla Lame, MD;  Location: [place];  Service: Endoscopy;  Laterality: N/A;  . FINGER ARTHROSCOPY WITH CARPOMETACARPEL ([place]) ARTHROPLASTY Right 02/04/2019   Procedure: FINGER ARTHROSCOPY WITH CARPOMETACARPEL [facility]) ARTHROPLASTY;  Surgeon: Hessie Knows, MD;  Location: [facility];  Service: Orthopedics;  Laterality: Right;  . LUMBAR MICRODISCECTOMY Bilateral 2000   L4 and L5  . POLYPECTOMY  07/10/2015   Procedure: POLYPECTOMY;  Surgeon: Lucilla Lame, MD;  Location: [place];  Service: Endoscopy;;  . SHOULDER SURGERY Right 2014   Torn Supraspinatus    There were no vitals filed for this visit.  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  Yes    Pain Score  --   1-3/10   Pain Location  Hand    Pain Orientation  Left    Pain Descriptors / Indicators  Aching;Tender;Tightness;Burning    Pain Type  Surgical pain;Acute pain    Pain Onset  More than a month ago    Aggravating Factors   ARO or pressure         [facility] OT Assessment - 05/25/19 0001      AROM   Left Wrist Extension  65 Degrees    Left Wrist Flexion  80 Degrees      Left Hand AROM   L Thumb MCP 0-60  35 Degrees    L Thumb IP 0-80  40 Degrees    L Thumb Radial ADduction/ABduction 0-55  58   pain into add   L Thumb Palmar ADduction/ABduction 0-45  68   pain in add   L Thumb Opposition to Index  --   pain 2/10 to 5th  digit     Pt has great AROM for wrist and PA, RA  But adduction painful but because of edema  And opposition painful to 5th  Reinforce for pt to not push ROM and not do AROM constantly  Only 3 x day - pt in [facility] neoprene splint now because of hard splint rubbing his incision  Still red but close  Pain and edema over MP of thumb  And in webspace down to incision          OT Treatments/Exercises (OP) - 05/25/19 0001      LUE Contrast Bath   Time  9 minutes    Comments  decrease pain and edema , prior to ROM        BLock AROM to IP and MP of thumb 10x PA and RA of thumb - pain free 10x Opposition pick up 1cm foam block - 5 reps and pain free in 3 -4 day can touch tips and slide down to distal fold of 5th   Pain free in 3 days -and less than 1-2/10 with AROM  Can do 2 sets  But only 3 x day  Can do light coban wrap to decrease edema -and prior to put on [facility] neoprene splint   Cont contrast prior to HEP  Can use  some Epson salt for edema        OT Education - 05/25/19 1241    Education Details  HEP to decrease pain and edema    Person(s) Educated  Patient    Methods  Explanation;Demonstration;Tactile cues;Verbal cues    Comprehension  Verbalized understanding;Returned demonstration;Verbal cues required       OT Short Term Goals - 05/04/19 1628      OT SHORT TERM GOAL #1   Title  Pt to be independent in HEP to decrease scar tissue, increase ROM and strength to wean out of splint    Baseline  sleeping not in splint - but soft one and pain at rest 5/10    Time  3    Period  Weeks    Status  New    Target Date  05/25/19        OT Long Term Goals - 05/04/19 1628      OT LONG TERM GOAL #1   Title  Pt to show L thumb and wrist AROM WNL with out pain to initiate use in ADL's without pain    Baseline  not using hand - thumb spica on most all the time    Time  3    Period  Weeks    Status  New    Target Date  05/25/19      OT LONG TERM GOAL #2   Title  Grip and prehension strength in L hand improve to [facility] and more than 60% compare to R to perform ADL;s and IADL's without pain or sypmtoms    Baseline  3 1/2 wks s/p    Time  6    Period  Weeks    Status  New    Target Date  06/15/19      OT LONG TERM GOAL #3   Title  Function score on PRWHE improve with more than 30 points    Baseline  PRWHE function score at eval 46/50    Time  9    Period  Weeks    Status  New    Target Date  07/06/19  Plan - 05/25/19 1242    Clinical Impression Statement  Pt is 6 1/2 wks s/p L CMC arthropllasty - pt pain decrease to 1/10 at rest and during session reinforce again to keep pain under 2/10 - and not to constantly move or do HEP - only 3 x day -and keep pain under 2/10 pain - to decrease edema and pain    OT Occupational Profile and History  Problem Focused Assessment - Including review of records relating to presenting problem    Occupational performance deficits (Please refer to  evaluation for details):  ADL's;IADL's;Work;Play;Leisure;Social Participation    Body Structure / Function / Physical Skills  ADL;Flexibility;ROM;UE functional use;FMC;Scar mobility;Dexterity;Edema;Pain;IADL;Coordination    Rehab Potential  Good    Clinical Decision Making  Limited treatment options, no task modification necessary    Comorbidities Affecting Occupational Performance:  None    Modification or Assistance to Complete Evaluation   No modification of tasks or assist necessary to complete eval    OT Frequency  1x / week    OT Duration  8 weeks    OT Treatment/Interventions  Self-care/ADL training;Patient/family education;Splinting;Therapeutic exercise;Paraffin;Fluidtherapy;Contrast Bath;Manual Therapy;Passive range of motion;Scar mobilization    Plan  assess progress with HEP - if pain and edema decrease    OT Home Exercise Plan  see pt instruction    Consulted and Agree with Plan of Care  Patient       Patient will benefit from skilled therapeutic intervention in order to improve the following deficits and impairments:   Body Structure / Function / Physical Skills: ADL, Flexibility, ROM, UE functional use, FMC, Scar mobility, Dexterity, Edema, Pain, IADL, Coordination       Visit Diagnosis: Pain in left hand  Stiffness of left hand, not elsewhere classified  Muscle weakness (generalized)  Stiffness of right hand, not elsewhere classified  Pain in right hand    Problem List Patient Active Problem List   Diagnosis Date Noted  . Hyperglycemia 10/16/2015  . Benign neoplasm of ascending colon   . Hyperlipidemia 06/29/2015    Rosalyn Gess OTR/L,CLT 05/25/2019, 12:45 PM  [place] [facility] [address] [place], [address] Phone: [phone]   Fax:  [phone]  Name: ZAHIER LOJEWSKI MRN: [id number] Date of Birth: 04-11-1960

## 2019-06-02 ENCOUNTER — Ambulatory Visit: Payer: 59 | Admitting: Occupational Therapy

## 2019-06-02 ENCOUNTER — Other Ambulatory Visit: Payer: Self-pay

## 2019-06-02 DIAGNOSIS — M79642 Pain in left hand: Secondary | ICD-10-CM | POA: Diagnosis not present

## 2019-06-02 DIAGNOSIS — M25641 Stiffness of right hand, not elsewhere classified: Secondary | ICD-10-CM

## 2019-06-02 DIAGNOSIS — M6281 Muscle weakness (generalized): Secondary | ICD-10-CM

## 2019-06-02 DIAGNOSIS — M79641 Pain in right hand: Secondary | ICD-10-CM

## 2019-06-02 DIAGNOSIS — M25642 Stiffness of left hand, not elsewhere classified: Secondary | ICD-10-CM

## 2019-06-02 NOTE — Therapy (Signed)
[place] [facility] [address], [place], [address] Phone: [phone]   Fax:  [phone]  Occupational Therapy Treatment  Patient Details  Name: Douglas Friedman MRN: [id number] Date of Birth: 01/01/1961 Referring Provider (OT): Rudene Christians   Encounter Date: 06/02/2019  OT End of Session - 06/02/19 1538    Visit Number  4    Number of Visits  12    Date for OT Re-Evaluation  07/06/19    OT Start Time  [id number]    OT Stop Time  1535    OT Time Calculation (min)  50 min    Activity Tolerance  Patient tolerated treatment well    Behavior During Therapy  [facility] for tasks assessed/performed       Past Medical History:  Diagnosis Date  . Arthritis   . Hypercholesteremia     Past Surgical History:  Procedure Laterality Date  . CARPOMETACARPAL ([place]) FUSION OF THUMB Left 04/08/2019   Procedure: CARPOMETACARPAL [facility]) ARTHROPLASTY;  Surgeon: Hessie Knows, MD;  Location: [facility];  Service: Orthopedics;  Laterality: Left;  . CERVICAL FUSION  2005   x2  . COLONOSCOPY WITH PROPOFOL N/A 07/10/2015   Procedure: COLONOSCOPY WITH PROPOFOL;  Surgeon: Lucilla Lame, MD;  Location: [place];  Service: Endoscopy;  Laterality: N/A;  . FINGER ARTHROSCOPY WITH CARPOMETACARPEL ([place]) ARTHROPLASTY Right 02/04/2019   Procedure: FINGER ARTHROSCOPY WITH CARPOMETACARPEL [facility]) ARTHROPLASTY;  Surgeon: Hessie Knows, MD;  Location: [facility];  Service: Orthopedics;  Laterality: Right;  . LUMBAR MICRODISCECTOMY Bilateral 2000   L4 and L5  . POLYPECTOMY  07/10/2015   Procedure: POLYPECTOMY;  Surgeon: Lucilla Lame, MD;  Location: [place];  Service: Endoscopy;;  . SHOULDER SURGERY Right 2014   Torn Supraspinatus    There were no vitals filed for this visit.  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  Yes    Pain Score  1     Pain Location  Hand    Pain Orientation  Left    Pain Descriptors / Indicators  Aching;Tender;Tightness    Pain Type  Surgical pain    Pain Onset  More than a month ago    Pain Frequency  Intermittent      Pt show increase AROM and decrease pain with AROM and at rest   able to keep pain this past week below 2/10             OT Treatments/Exercises (OP) - 06/02/19 0001      LUE Fluidotherapy   Number Minutes Fluidotherapy  11 Minutes    LUE Fluidotherapy Location  Hand;Wrist    Comments  AROM with ice inbetween each 2 min       pain free and increase AROM in fluido -but done contrast inbetween with some ice  Review and done HEP in clinic   AROM for thumb PA, RA  Add blocked AROM for IP  And MC of thumb - pain free  Opposition pain free to all digits slide down 5th -6 reps each  Gentle isometric for thumb PA and RA  2 x 5 reps Increase 3-4 days 2 sets then 6/7 days 3 sets if pain free  Wrist AROM in all planes  And can do pain free isometric for wrist flexion ,ext -  10 reps for flexion ,ext         OT Education - 06/02/19 1537    Education Details  HEP changes    Person(s) Educated  Patient    Methods  Explanation;Demonstration;Tactile cues;Verbal cues    Comprehension  Verbalized understanding;Returned demonstration;Verbal cues required       OT Short Term Goals - 05/04/19 1628      OT SHORT TERM GOAL #1   Title  Pt to be independent in HEP to decrease scar tissue, increase ROM and strength to wean out of splint    Baseline  sleeping not in splint - but soft one and pain at rest 5/10    Time  3    Period  Weeks    Status  New    Target Date  05/25/19        OT  Long Term Goals - 05/04/19 1628      OT LONG TERM GOAL #1   Title  Pt to show L thumb and wrist AROM WNL with out pain to initiate use in ADL's without pain    Baseline  not using hand - thumb spica on most all the time    Time  3    Period  Weeks    Status  New    Target Date  05/25/19      OT LONG TERM GOAL #2   Title  Grip and prehension strength in L hand improve to [facility] and more than 60% compare to R to perform ADL;s and IADL's without pain or sypmtoms    Baseline  3 1/2 wks s/p    Time  6    Period  Weeks    Status  New    Target Date  06/15/19      OT LONG TERM GOAL #3   Title  Function score on PRWHE improve with more than 30 points    Baseline  PRWHE function score at eval 46/50    Time  9    Period  Weeks    Status  New    Target Date  07/06/19            Plan - 06/02/19 1542    Clinical Impression Statement  Pt is 7 1/2 wks s/p L CMC arthroplasty - pt pain decrease to lessthan 2/10 at the worse - able to add isometric to PA and RA - pain free- and blocked AROM for IP and MC - but pt to keep pain under 1-2/1    OT Occupational Profile and History  Problem Focused Assessment - Including review of records relating to presenting problem    Occupational performance deficits (Please refer to evaluation for details):  ADL's;IADL's;Work;Play;Leisure;Social Participation    Body Structure / Function / Physical Skills  ADL;Flexibility;ROM;UE functional use;FMC;Scar mobility;Dexterity;Edema;Pain;IADL;Coordination    Rehab Potential  Good    Clinical Decision Making  Limited treatment options, no task modification necessary    Comorbidities Affecting Occupational Performance:  None    Modification or Assistance to Complete Evaluation   No modification of tasks or assist necessary to complete eval    OT Frequency  1x / week  OT Duration  6 weeks    OT Treatment/Interventions  Self-care/ADL training;Patient/family education;Splinting;Therapeutic  exercise;Paraffin;Fluidtherapy;Contrast Bath;Manual Therapy;Passive range of motion;Scar mobilization    Plan  assess progress with HEP - if pain and edema decrease    OT Home Exercise Plan  see pt instruction    Consulted and Agree with Plan of Care  Patient       Patient will benefit from skilled therapeutic intervention in order to improve the following deficits and impairments:   Body Structure / Function / Physical Skills: ADL, Flexibility, ROM, UE functional use, FMC, Scar mobility, Dexterity, Edema, Pain, IADL, Coordination       Visit Diagnosis: Pain in left hand  Stiffness of left hand, not elsewhere classified  Muscle weakness (generalized)  Stiffness of right hand, not elsewhere classified  Pain in right hand    Problem List Patient Active Problem List   Diagnosis Date Noted  . Hyperglycemia 10/16/2015  . Benign neoplasm of ascending colon   . Hyperlipidemia 06/29/2015    Rosalyn Gess OTR/L,CLT 06/02/2019, 3:46 PM  [place] [facility] [address], [place], [address] Phone: [phone]   Fax:  [phone]  Name: Douglas Friedman MRN: [id number] Date of Birth: 01/13/1961

## 2019-06-02 NOTE — Patient Instructions (Signed)
Cont contrast  AROM for thumb PA, RA  Add blocked AROM for IP  And MC of thumb  Opposition pain free to all digits slide down 5th  Gentle isometric for thumb PA and RA  2 x 5 reps Increase 3-4 days 2 sets then 6/7 days 3 sets if pain free Wrist AROM in all planes  And can do pain free isometric for wrist flexion ,ext -  10 reps for flexion ,ext

## 2019-06-11 ENCOUNTER — Other Ambulatory Visit: Payer: Self-pay

## 2019-06-11 ENCOUNTER — Ambulatory Visit: Payer: 59 | Attending: Orthopedic Surgery | Admitting: Occupational Therapy

## 2019-06-11 DIAGNOSIS — M25642 Stiffness of left hand, not elsewhere classified: Secondary | ICD-10-CM | POA: Diagnosis present

## 2019-06-11 DIAGNOSIS — M79641 Pain in right hand: Secondary | ICD-10-CM | POA: Insufficient documentation

## 2019-06-11 DIAGNOSIS — M6281 Muscle weakness (generalized): Secondary | ICD-10-CM | POA: Diagnosis present

## 2019-06-11 DIAGNOSIS — M79642 Pain in left hand: Secondary | ICD-10-CM | POA: Diagnosis present

## 2019-06-11 DIAGNOSIS — M25641 Stiffness of right hand, not elsewhere classified: Secondary | ICD-10-CM | POA: Diagnosis present

## 2019-06-11 NOTE — Therapy (Signed)
[facility] [address], [place], [address] Phone: [phone]   Fax:  [phone]  Occupational Therapy Treatment  Patient Details  Name: Douglas Friedman MRN: [id number] Date of Birth: January 20, 1961 Referring Provider (OT): Rudene Christians   Encounter Date: 06/11/2019  OT End of Session - 06/11/19 1040    Visit Number  5    Number of Visits  12    Date for OT Re-Evaluation  07/06/19    OT Start Time  0958    OT Stop Time  1031    OT Time Calculation (min)  33 min    Activity Tolerance  Patient tolerated treatment well       Past Medical History:  Diagnosis Date  . Arthritis   . Hypercholesteremia     Past Surgical History:  Procedure Laterality Date  . CARPOMETACARPAL ([place]) FUSION OF THUMB Left 04/08/2019   Procedure: CARPOMETACARPAL [facility]) ARTHROPLASTY;  Surgeon: Hessie Knows, MD;  Location: [facility];  Service: Orthopedics;  Laterality: Left;  . CERVICAL FUSION  2005   x2  . COLONOSCOPY WITH PROPOFOL N/A 07/10/2015   Procedure: COLONOSCOPY WITH PROPOFOL;  Surgeon: Lucilla Lame, MD;  Location: [place];  Service: Endoscopy;  Laterality: N/A;  . FINGER ARTHROSCOPY WITH CARPOMETACARPEL ([place]) ARTHROPLASTY Right 02/04/2019   Procedure: FINGER ARTHROSCOPY WITH CARPOMETACARPEL [facility]) ARTHROPLASTY;  Surgeon: Hessie Knows, MD;  Location: [facility];  Service: Orthopedics;  Laterality: Right;  . LUMBAR MICRODISCECTOMY Bilateral 2000   L4 and L5  . POLYPECTOMY  07/10/2015   Procedure: POLYPECTOMY;  Surgeon: Lucilla Lame, MD;  Location: [place];  Service: Endoscopy;;  . SHOULDER SURGERY Right 2014   Torn Supraspinatus    There were no vitals filed for this visit.  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  Yes    Pain Location  Hand    Pain Orientation  Left    Pain Descriptors / Indicators  Aching;Tender;Tightness    Pain Type  Surgical pain    Pain Onset  More than a month ago    Pain Frequency  Intermittent    Aggravating Factors   ROM and strength         [facility] OT Assessment - 06/11/19 0001      Strength   Right Hand Grip (lbs)  70    Right Hand Lateral Pinch  17 lbs    Right Hand 3 Point Pinch  14 lbs    Left Hand Grip (lbs)  55    Left Hand Lateral Pinch  5 lbs    Left Hand 3 Point Pinch  5 lbs      AROM for L thumb to base of 5th  And pain at the most the last week 2/10  Scar adhesion improving    AROM for thumb PA, RA   blocked AROM for IP  And MC of thumb - pain free  Opposition pain free to all digits slide down  5th  - reinforce for pt to maintain OVAL Gentle isometric for thumb PA and RA tolerate well could do 2 sets at home         OT Treatments/Exercises (OP) - 06/11/19 0001      LUE Fluidotherapy   Number Minutes Fluidotherapy  8 Minutes    LUE Fluidotherapy Location  Hand;Wrist    Comments  AROM for thumb in all planes        retro extention of thumb with rotating palm /wrist to neutral maintaining thumb position 10 reps  UD of digits walking away from thumb - isometric stabilization of thumb  - 10 reps  Rubber band gentle PA and RA - pain free - and stop when feeling strain and NO hyper extention of MP of thumb  Light blue putty for lat grip - straight plain -12 reps  Pain free  Increase resistance 3 days 2 sets and 5-6 days if pain free 3 sets  HOLD off on 3 point grip with putty            OT Education - 06/11/19 1040    Education Details  progress and HEP    Person(s) Educated  Patient    Methods  Explanation;Demonstration;Tactile  cues;Verbal cues    Comprehension  Verbalized understanding;Returned demonstration;Verbal cues required       OT Short Term Goals - 05/04/19 1628      OT SHORT TERM GOAL #1   Title  Pt to be independent in HEP to decrease scar tissue, increase ROM and strength to wean out of splint    Baseline  sleeping not in splint - but soft one and pain at rest 5/10    Time  3    Period  Weeks    Status  New    Target Date  05/25/19        OT Long Term Goals - 05/04/19 1628      OT LONG TERM GOAL #1   Title  Pt to show L thumb and wrist AROM WNL with out pain to initiate use in ADL's without pain    Baseline  not using hand - thumb spica on most all the time    Time  3    Period  Weeks    Status  New    Target Date  05/25/19      OT LONG TERM GOAL #2   Title  Grip and prehension strength in L hand improve to [facility] and more than 60% compare to R to perform ADL;s and IADL's without pain or sypmtoms    Baseline  3 1/2 wks s/p    Time  6    Period  Weeks    Status  New    Target Date  06/15/19      OT LONG TERM GOAL #3   Title  Function score on PRWHE improve with more than 30 points    Baseline  PRWHE function score at eval 46/50    Time  9    Period  Weeks    Status  New    Target Date  07/06/19            Plan - 06/11/19 1041    Clinical Impression Statement  Pt is 9 wks s/p L CMC arthroplasty- pain staying about 2/10 at the worse - upgrade strengthening but to keep pain under 2/10 - and make sure thumb CMC not collapse in ADD during opposition working on retro extention of thumb  OT Occupational Profile and History  Problem Focused Assessment - Including review of records relating to presenting problem    Occupational performance deficits (Please refer to evaluation for details):  ADL's;IADL's;Work;Play;Leisure;Social Participation    Body Structure / Function / Physical Skills  ADL;Flexibility;ROM;UE functional use;FMC;Scar mobility;Dexterity;Edema;Pain;IADL;Coordination     Rehab Potential  Good    Clinical Decision Making  Limited treatment options, no task modification necessary    Comorbidities Affecting Occupational Performance:  None    Modification or Assistance to Complete Evaluation   No modification of tasks or assist necessary to complete eval    OT Frequency  1x / week    OT Duration  4 weeks    OT Treatment/Interventions  Self-care/ADL training;Patient/family education;Splinting;Therapeutic exercise;Paraffin;Fluidtherapy;Contrast Bath;Manual Therapy;Passive range of motion;Scar mobilization    Plan  assess progress with HEP - if pain and edema decrease    OT Home Exercise Plan  see pt instruction       Patient will benefit from skilled therapeutic intervention in order to improve the following deficits and impairments:   Body Structure / Function / Physical Skills: ADL, Flexibility, ROM, UE functional use, FMC, Scar mobility, Dexterity, Edema, Pain, IADL, Coordination       Visit Diagnosis: Pain in left hand  Stiffness of left hand, not elsewhere classified  Muscle weakness (generalized)  Stiffness of right hand, not elsewhere classified  Pain in right hand    Problem List Patient Active Problem List   Diagnosis Date Noted  . Hyperglycemia 10/16/2015  . Benign neoplasm of ascending colon   . Hyperlipidemia 06/29/2015    Rosalyn Gess OTR/L,CLT 06/11/2019, 10:44 AM  [place] [facility] [address] [place], [address] Phone: [phone]   Fax:  [phone]  Name: HASCAL GARROD MRN: [id number] Date of Birth: 08/22/1960

## 2019-06-11 NOTE — Patient Instructions (Signed)
Same HEP  But add retro extention of thumb with rotating palm /wrist to neutral maintaining thumb position 10 reps UD of digits walking away from thumb - isometric stabilization of thumb  Rubber band gentle PA and RA - pain free - and stop when feeling strain Light blue putty for lat grip - straight plain -12 reps  Pain free  Increase resistance 3 days 2 sets and 5-6 days if pain free 3 sets

## 2019-06-18 ENCOUNTER — Ambulatory Visit: Payer: 59 | Admitting: Occupational Therapy

## 2019-06-25 ENCOUNTER — Ambulatory Visit: Payer: 59 | Admitting: Occupational Therapy

## 2019-11-25 ENCOUNTER — Ambulatory Visit
Admission: EM | Admit: 2019-11-25 | Discharge: 2019-11-25 | Disposition: A | Discharge status: Home / Self Care | Payer: 59 | Attending: Emergency Medicine | Admitting: Emergency Medicine

## 2019-11-25 ENCOUNTER — Other Ambulatory Visit: Payer: Self-pay

## 2019-11-25 DIAGNOSIS — B349 Viral infection, unspecified: Secondary | ICD-10-CM | POA: Diagnosis not present

## 2019-11-25 DIAGNOSIS — Z0189 Encounter for other specified special examinations: Secondary | ICD-10-CM | POA: Diagnosis not present

## 2019-11-25 NOTE — Discharge Instructions (Signed)
Your COVID test is pending.  You should self quarantine until the test result is back.    Take Tylenol as needed for fever or discomfort.  Rest and keep yourself hydrated.    Go to the emergency department if you develop acute worsening symptoms.     

## 2019-11-25 NOTE — ED Provider Notes (Signed)
[facility]    CSN: [id number] Arrival date & time: 11/25/19  1233      History   Chief Complaint Chief Complaint  Patient presents with  . Cough    HPI Douglas Friedman is a 59 y.o. male.   Patient presents with nasal congestion and nonproductive cough x 2 days.  He also reports fatigue and diarrhea this morning x2 episodes.  Treatment at home attempted with Mucinex.  He denies fever, chills, shortness of breath, abdominal pain, vomiting, or other symptoms.    The history is provided by the patient.    Past Medical History:  Diagnosis Date  . Arthritis   . Hypercholesteremia     Patient Active Problem List   Diagnosis Date Noted  . Hyperglycemia 10/16/2015  . Benign neoplasm of ascending colon   . Hyperlipidemia 06/29/2015    Past Surgical History:  Procedure Laterality Date  . CARPOMETACARPAL ([place]) FUSION OF THUMB Left 04/08/2019   Procedure: CARPOMETACARPAL [facility]) ARTHROPLASTY;  Surgeon: Hessie Knows, MD;  Location: [facility];  Service: Orthopedics;  Laterality: Left;  . CERVICAL FUSION  2005   x2  . COLONOSCOPY WITH PROPOFOL N/A 07/10/2015   Procedure: COLONOSCOPY WITH PROPOFOL;  Surgeon: Lucilla Lame, MD;  Location: [place];  Service: Endoscopy;  Laterality: N/A;  . FINGER ARTHROSCOPY WITH CARPOMETACARPEL ([place]) ARTHROPLASTY Right 02/04/2019   Procedure: FINGER ARTHROSCOPY WITH CARPOMETACARPEL [facility]) ARTHROPLASTY;  Surgeon: Hessie Knows, MD;  Location: [facility];  Service: Orthopedics;  Laterality: Right;  . LUMBAR MICRODISCECTOMY Bilateral 2000   L4 and L5  . POLYPECTOMY  07/10/2015   Procedure: POLYPECTOMY;  Surgeon: Lucilla Lame, MD;  Location: [place];  Service: Endoscopy;;  . SHOULDER SURGERY Right 2014   Torn Supraspinatus       Home Medications    Prior to Admission medications   Not on File    Family History Family History  Problem Relation Age of Onset  . Diabetes Mother   . Diabetes Brother     Social  History Social History   Tobacco Use  . Smoking status: Former Smoker    Types: Cigarettes  . Smokeless tobacco: Never Used  . Tobacco comment: quit 1988  Vaping Use  . Vaping Use: Never used  Substance Use Topics  . Alcohol use: Yes    Alcohol/week: 6.0 standard drinks    Types: 6 Cans of beer per week  . Drug use: No     Allergies   Patient has no known allergies.   Review of Systems Review of Systems  Constitutional: Positive for fatigue. Negative for chills and fever.  HENT: Positive for congestion. Negative for ear pain and sore throat.   Eyes: Negative for pain and visual disturbance.  Respiratory: Positive for cough. Negative for shortness of breath.   Cardiovascular: Negative for chest pain and palpitations.  Gastrointestinal: Positive for diarrhea. Negative for abdominal pain and vomiting.  Genitourinary: Negative for dysuria and hematuria.  Musculoskeletal: Negative for arthralgias and back pain.  Skin: Negative for color change and rash.  Neurological: Negative for seizures and syncope.  All other systems reviewed and are negative.    Physical Exam Triage Vital Signs ED Triage Vitals  Enc Vitals Group     BP      Pulse      Resp      Temp      Temp src      SpO2      Weight  Height      Head Circumference      Peak Flow      Pain Score      Pain Loc      Pain Edu?      Excl. in [place]?    No data found.  Updated Vital Signs BP (!) 177/108 (BP Location: Left Arm)   Pulse 63   Temp 98.2 F (36.8 C) (Oral)   Resp 17   Ht 5\' 10"  (1.778 m)   Wt 230 lb (104.3 kg)   SpO2 93%   BMI 33.00 kg/m   Visual Acuity Right Eye Distance:   Left Eye Distance:   Bilateral Distance:    Right Eye Near:   Left Eye Near:    Bilateral Near:     Physical Exam Vitals and nursing note reviewed.  Constitutional:      General: He is not in acute distress.    Appearance: He is well-developed. He is not ill-appearing.  HENT:     Head: Normocephalic and  atraumatic.     Right Ear: Tympanic membrane normal.     Left Ear: Tympanic membrane normal.     Nose: Nose normal.     Mouth/Throat:     Mouth: Mucous membranes are moist.     Pharynx: Oropharynx is clear.  Eyes:     Conjunctiva/sclera: Conjunctivae normal.  Cardiovascular:     Rate and Rhythm: Normal rate and regular rhythm.     Heart sounds: No murmur heard.   Pulmonary:     Effort: Pulmonary effort is normal. No respiratory distress.     Breath sounds: Normal breath sounds. No wheezing or rhonchi.  Abdominal:     Palpations: Abdomen is soft.     Tenderness: There is no abdominal tenderness. There is no guarding or rebound.  Musculoskeletal:     Cervical back: Neck supple.  Skin:    General: Skin is warm and dry.     Findings: No rash.  Neurological:     General: No focal deficit present.     Mental Status: He is alert and oriented to person, place, and time.     Gait: Gait normal.  Psychiatric:        Mood and Affect: Mood normal.        Behavior: Behavior normal.      UC Treatments / Results  Labs (all labs ordered are listed, but only abnormal results are displayed) Labs Reviewed  NOVEL CORONAVIRUS, NAA    EKG   Radiology No results found.  Procedures Procedures (including critical care time)  Medications Ordered in UC Medications - No data to display  Initial Impression / Assessment and Plan / UC Course  I have reviewed the triage vital signs and the nursing notes.  Pertinent labs & imaging results that were available during my care of the patient were reviewed by me and considered in my medical decision making (see chart for details).   Viral illness.  PCR COVID pending.  Instructed patient to self quarantine until the test result is back.  Discussed with patient that he can take Tylenol as needed for fever or discomfort.  Instructed patient to go to the emergency department if he develops high fever, shortness of breath, severe diarrhea, or other  concerning symptoms.  Patient agrees with plan of care.     Final Clinical Impressions(s) / UC Diagnoses   Final diagnoses:  Patient request for diagnostic testing  Viral illness     Discharge  Instructions     Your COVID test is pending.  You should self quarantine until the test result is back.    Take Tylenol as needed for fever or discomfort.  Rest and keep yourself hydrated.    Go to the emergency department if you develop acute worsening symptoms.        ED Prescriptions    None     PDMP not reviewed this encounter.   Sharion Balloon, NP 11/25/19 1317

## 2019-11-25 NOTE — ED Triage Notes (Signed)
Patient with cough, congestion x Tuesday. States that he has fatigued as well. No known exposure to Covid but would like to be tested.

## 2019-11-27 LAB — NOVEL CORONAVIRUS, NAA: SARS-CoV-2, NAA: NOT DETECTED

## 2019-11-27 LAB — SARS-COV-2, NAA 2 DAY TAT

## 2021-08-29 ENCOUNTER — Ambulatory Visit (INDEPENDENT_AMBULATORY_CARE_PROVIDER_SITE_OTHER): Payer: 59 | Admitting: Nurse Practitioner

## 2021-08-29 ENCOUNTER — Encounter: Payer: Self-pay | Admitting: Nurse Practitioner

## 2021-08-29 VITALS — BP 184/100 | HR 81 | Temp 97.5°F | Resp 18 | Ht 69.25 in | Wt 230.5 lb

## 2021-08-29 DIAGNOSIS — Z23 Encounter for immunization: Secondary | ICD-10-CM

## 2021-08-29 DIAGNOSIS — C434 Malignant melanoma of scalp and neck: Secondary | ICD-10-CM | POA: Diagnosis not present

## 2021-08-29 DIAGNOSIS — E782 Mixed hyperlipidemia: Secondary | ICD-10-CM | POA: Diagnosis not present

## 2021-08-29 DIAGNOSIS — I10 Essential (primary) hypertension: Secondary | ICD-10-CM | POA: Diagnosis not present

## 2021-08-29 DIAGNOSIS — Z8673 Personal history of transient ischemic attack (TIA), and cerebral infarction without residual deficits: Secondary | ICD-10-CM

## 2021-08-29 DIAGNOSIS — Z1211 Encounter for screening for malignant neoplasm of colon: Secondary | ICD-10-CM

## 2021-08-29 MED ORDER — VALSARTAN 80 MG PO TABS: 80.0000 mg | ORAL_TABLET | Freq: Every day | ORAL | 0 refills | Status: DC

## 2021-08-29 NOTE — Assessment & Plan Note (Signed)
Continue follow-up appointments with dermatology.

## 2021-08-29 NOTE — Assessment & Plan Note (Signed)
History of TIA on 08/20/2021.  Patient had left-sided numbness and loss of vision in left eye.  Patient seen in the emergency department.  Symptoms resolved after 6 minutes.  CT scan was negative.  Referral placed for neurology by emergency department patient unable to get into appointment until August.  Urgent referral placed with Dr. Manuella Ghazi neurology in [place], to hopefully get him in sooner.  Patient was started on aspirin, Plavix, and atorvastatin.  We will continue on current course of treatment.

## 2021-08-29 NOTE — Progress Notes (Signed)
BP (!) 184/100   Pulse 81   Temp (!) 97.5 F (36.4 C)   Resp 18   Ht 5' 9.25" (1.759 m)   Wt 230 lb 8 oz (104.6 kg)   SpO2 97%   BMI 33.79 kg/m    Subjective:    Patient ID: Douglas Friedman, male    DOB: 05/09/1960, 61 y.o.   MRN: [id number]  HPI: Douglas Friedman is a 61 y.o. male, he is here with wife  Chief Complaint  Patient presents with   Establish Care   Hypertension    ER f/u for TIA   Establish care: [place], and likes to rebuild vehicles.  He is married. Last physical was probably two years ago.  Patient was recently in the emergency room for a TIA.  Skin cancer: Seeing dermatology, Dr. Renne Musca , [facility] has had melanoma taken off his head.  He says he has had multiple spots that have been removed.  He is going back for more removal.  Er follow up/history of YCX:KGYJ sided weakness, numbness and lost vision in left eye, lasted for about 6 minutes. CT done in er.. Pt started on asa, plavix and atorvastatin.  Patient was hypertensive in the emergency department.  Blood pressure was 190/120 with a heart rate of 84.  They did put a referral in for neurology for the patient to follow-up with however he does not have an appointment with them until August.  Put an urgent referral in for Dr. Manuella Ghazi neurology to see if we get him in sooner.  Patient is very concerned about his health at this time.  Discussed signs and symptoms to watch out for to return to the emergency department.   CT head showed: No evidence of dissection, occlusion, or aneurysm of the intracranial arteries.  CT neck showed: No evidence of dissection, aneurysm, or flow limiting stenosis of the carotid or vertebral arteries.  Hypertension: Patient has had multiple elevated blood pressure readings.  In the emergency department on 08/20/2021 his blood pressure was 190/120.  Upon arrival to the office his blood pressure was 196/94 retake was 184/100.  Says he has never been on blood  pressure medication before.  Patient does not have a blood pressure cuff at home.  Discussed getting a blood pressure cuff and checking his blood pressure every day.  And keeping a log.  Starting patient on valsartan 80 mg daily.  Patient will come back in 1 week for blood pressure check.  And follow-up in 3 months with me.  Patient does report that he has had headaches and episodes of shortness of breath.  Patient does deny chest pain and blurred vision.  Hyperlipidemia: His LDL 6 years ago was 134.  Most recently at his ER visit on 08/20/2021 his LDL was 80.  He is currently taking atorvastatin 40 mg daily.  Denies any myalgia.  We will get labs at his next visit.  Mental health: Reports being very stressed out due to his health.  Discussed that it is normal to be concerned about his health when he has so much going on.  Reports patient has definitely been more irritable lately.     08/29/2021    2:45 PM 03/23/2019   10:39 AM 10/16/2015    9:23 AM 06/20/2015    9:07 AM 05/23/2015    9:57 AM  Depression screen PHQ 2/9  Decreased Interest 2 0 0 0 0  Down, Depressed, Hopeless 2 0  0 0 0  PHQ - 2 Score 4 0 0 0 0  Altered sleeping 0 0     Tired, decreased energy 1 0     Change in appetite 2 0     Feeling bad or failure about yourself  0 0     Trouble concentrating 1 0     Moving slowly or fidgety/restless 0 0     Suicidal thoughts 0 0     PHQ-9 Score 8 0     Difficult doing work/chores Somewhat difficult Not difficult at all          08/29/2021    2:46 PM  GAD 7 : Generalized Anxiety Score  Nervous, Anxious, on Edge 3  Control/stop worrying 3  Worry too much - different things 3  Trouble relaxing 3  Restless 0  Easily annoyed or irritable 3  Afraid - awful might happen 0  Total GAD 7 Score 15  Anxiety Difficulty Not difficult at all     Relevant past medical, surgical, family and social history reviewed and updated as indicated. Interim medical history since our last visit  reviewed. Allergies and medications reviewed and updated.  Review of Systems  Constitutional: Negative for fever or weight change.  Respiratory: Negative for cough and shortness of breath.   Cardiovascular: Negative for chest pain or palpitations.  Gastrointestinal: Negative for abdominal pain, no bowel changes.  Musculoskeletal: Negative for gait problem or joint swelling.  Skin: Negative for rash. Positive for melanoma Neurological: Negative for dizziness, positive for headaches.  No other specific complaints in a complete review of systems (except as listed in HPI above).      Objective:    BP (!) 184/100   Pulse 81   Temp (!) 97.5 F (36.4 C)   Resp 18   Ht 5' 9.25" (1.759 m)   Wt 230 lb 8 oz (104.6 kg)   SpO2 97%   BMI 33.79 kg/m   Wt Readings from Last 3 Encounters:  08/29/21 230 lb 8 oz (104.6 kg)  11/25/19 230 lb (104.3 kg)  04/08/19 233 lb 11 oz (106 kg)    Physical Exam  Constitutional: Patient appears well-developed and well-nourished. Obese  No distress.  HEENT: head atraumatic, normocephalic, pupils equal and reactive to light, neck supple Cardiovascular: Normal rate, regular rhythm and normal heart sounds.  No murmur heard. No BLE edema. Pulmonary/Chest: Effort normal and breath sounds normal. No respiratory distress. Abdominal: Soft.  There is no tenderness. Psychiatric: Patient has a normal mood and affect. behavior is normal. Judgment and thought content normal.   Results for orders placed or performed during the hospital encounter of 11/25/19  Novel Coronavirus, NAA ([facility])   Specimen: Nasopharyngeal Swab; Nasopharyngeal(NP) swabs in vial transport medium   Nasopharynge  Result Value Ref Range   SARS-CoV-2, NAA Not Detected Not Detected  SARS-COV-2, NAA 2 DAY TAT   Nasopharynge  Result Value Ref Range   SARS-CoV-2, NAA 2 DAY TAT Performed       Assessment & Plan:   Problem List Items Addressed This Visit       Cardiovascular and  Mediastinum   Hypertension    Patient has had multiple high blood pressure readings.  Patient reports never being on blood pressure medication before.  Today was 196 over 94 retake was 184/100.  Starting patient on valsartan 80 mg daily.  Patient is going to get a blood pressure cuff and recheck his blood pressure daily and keep a log.  Patient  coming back next week for a blood pressure check.       Relevant Medications   atorvastatin (LIPITOR) 40 MG tablet   aspirin EC 81 MG tablet   valsartan (DIOVAN) 80 MG tablet     Other   Hyperlipidemia    Recent LDL done on 08/20/2021 was 80.  Patient currently taking atorvastatin 40 mg daily.  We will continue with current treatment.       Relevant Medications   atorvastatin (LIPITOR) 40 MG tablet   aspirin EC 81 MG tablet   valsartan (DIOVAN) 80 MG tablet   History of TIA (transient ischemic attack) - Primary    History of TIA on 08/20/2021.  Patient had left-sided numbness and loss of vision in left eye.  Patient seen in the emergency department.  Symptoms resolved after 6 minutes.  CT scan was negative.  Referral placed for neurology by emergency department patient unable to get into appointment until August.  Urgent referral placed with Dr. Manuella Ghazi neurology in [place], to hopefully get him in sooner.  Patient was started on aspirin, Plavix, and atorvastatin.  We will continue on current course of treatment.       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        Relevant Medications   aspirin EC 81 MG tablet   Other Visit Diagnoses     Need for Tdap vaccination       Relevant Orders   Tdap vaccine greater than or equal to 7yo IM (Completed)   Screening for colon cancer       Relevant Orders   Ambulatory referral to Gastroenterology        Follow up plan: Return for one week nurse visit check blood pressure, three months follow  up .

## 2021-08-29 NOTE — Assessment & Plan Note (Signed)
Recent LDL done on 08/20/2021 was 80.  Patient currently taking atorvastatin 40 mg daily.  We will continue with current treatment.

## 2021-08-29 NOTE — Assessment & Plan Note (Signed)
Patient has had multiple high blood pressure readings.  Patient reports never being on blood pressure medication before.  Today was 196 over 94 retake was 184/100.  Starting patient on valsartan 80 mg daily.  Patient is going to get a blood pressure cuff and recheck his blood pressure daily and keep a log.  Patient coming back next week for a blood pressure check.

## 2021-08-30 ENCOUNTER — Telehealth: Payer: Self-pay | Admitting: Nurse Practitioner

## 2021-08-30 NOTE — Telephone Encounter (Signed)
Pt is calling to request [employer] assistance with [employer] to start the Short Disabilty process.   Pt will see the numerologist on 09/07/21 at 8:00a. Pt is back at work. And has htn. Pt is not sure that he can wait another week.  Pt is request a letter to write him out for short term disability  Cb- [phone]

## 2021-08-30 NOTE — Telephone Encounter (Signed)
Patient notified to call [employer] and have paperwork sent to this office and we will fill out and return

## 2021-08-31 ENCOUNTER — Telehealth: Payer: Self-pay

## 2021-08-31 NOTE — Telephone Encounter (Signed)
CALLED PATIENT NO ANSWER LEFT VOICEMAIL FOR A CALL BACK ? ?

## 2021-09-04 ENCOUNTER — Other Ambulatory Visit: Payer: Self-pay

## 2021-09-04 DIAGNOSIS — Z1211 Encounter for screening for malignant neoplasm of colon: Secondary | ICD-10-CM

## 2021-09-04 MED ORDER — NA SULFATE-K SULFATE-MG SULF 17.5-3.13-1.6 GM/177ML PO SOLN: 1.0000 | Freq: Once | ORAL | 0 refills | Status: AC

## 2021-09-04 NOTE — Progress Notes (Signed)
Gastroenterology Pre-Procedure Review Patient will send email with insurance card info Request Date: 10/01/2021 Requesting Physician: Dr. Marius Ditch   PATIENT REVIEW QUESTIONS: The patient responded to the following health history questions as indicated:    1. Are you having any GI issues? no 2. Do you have a personal history of Polyps? no 3. Do you have a family history of Colon Cancer or Polyps? yes (polyps ) 4. Diabetes Mellitus? no 5. Joint replacements in the past 12 months?no 6. Major health problems in the past 3 months?yes (mini stroke two weeks ago) 7. Any artificial heart valves, MVP, or defibrillator?no    MEDICATIONS & ALLERGIES:    Patient reports the following regarding taking any anticoagulation/antiplatelet therapy:   Plavix, Coumadin, Eliquis, Xarelto, Lovenox, Pradaxa, Brilinta, or Effient? yes (plavix) Aspirin? yes (81 mg)  Patient confirms/reports the following medications:  Current Outpatient Medications  Medication Sig Dispense Refill   aspirin EC 81 MG tablet Take 81 mg by mouth daily. Swallow whole.     atorvastatin (LIPITOR) 40 MG tablet Take by mouth.     clopidogrel (PLAVIX) 75 MG tablet Take by mouth.     valsartan (DIOVAN) 80 MG tablet Take 1 tablet (80 mg total) by mouth daily. 30 tablet 0   No current facility-administered medications for this visit.    Patient confirms/reports the following allergies:  No Known Allergies  No orders of the defined types were placed in this encounter.   AUTHORIZATION INFORMATION Primary Insurance: 1D#: Group #:  Secondary Insurance: 1D#: Group #:  SCHEDULE INFORMATION: Date: 10/01/2021 Time: Location: [facility]

## 2021-09-05 ENCOUNTER — Ambulatory Visit: Payer: 59

## 2021-09-05 ENCOUNTER — Other Ambulatory Visit: Payer: Self-pay | Admitting: Nurse Practitioner

## 2021-09-05 VITALS — BP 140/82 | HR 89

## 2021-09-05 DIAGNOSIS — I10 Essential (primary) hypertension: Secondary | ICD-10-CM

## 2021-09-05 DIAGNOSIS — Z8673 Personal history of transient ischemic attack (TIA), and cerebral infarction without residual deficits: Secondary | ICD-10-CM

## 2021-09-05 MED ORDER — CLOPIDOGREL BISULFATE 75 MG PO TABS: 75.0000 mg | ORAL_TABLET | Freq: Every day | ORAL | 1 refills | Status: DC

## 2021-09-05 NOTE — Progress Notes (Signed)
Pt here for bp check since last visit was high.  Today bp is 140/82, and second reading was 136/82

## 2021-09-07 NOTE — Progress Notes (Signed)
Resent blood thinner clearance and medical clearance

## 2021-09-08 ENCOUNTER — Other Ambulatory Visit: Payer: Self-pay | Admitting: Neurology

## 2021-09-08 DIAGNOSIS — G459 Transient cerebral ischemic attack, unspecified: Secondary | ICD-10-CM

## 2021-09-10 ENCOUNTER — Telehealth: Payer: Self-pay | Admitting: Emergency Medicine

## 2021-09-10 ENCOUNTER — Other Ambulatory Visit: Payer: Self-pay | Admitting: Nurse Practitioner

## 2021-09-10 ENCOUNTER — Telehealth: Payer: Self-pay | Admitting: Nurse Practitioner

## 2021-09-10 DIAGNOSIS — I10 Essential (primary) hypertension: Secondary | ICD-10-CM

## 2021-09-10 MED ORDER — HYDROCHLOROTHIAZIDE 12.5 MG PO TABS: 12.5000 mg | ORAL_TABLET | Freq: Every day | ORAL | 0 refills | Status: DC

## 2021-09-10 NOTE — Telephone Encounter (Signed)
Pt is calling to state that he was notified by [employer] that his disability paperwork was not received. Pt also thought he was going out of work until November. Pt has a return date of August. Please advise CB- [phone]

## 2021-09-10 NOTE — Telephone Encounter (Signed)
BP running high at 161/112 this morning.  Spoke to Neurologist at appointment was told not to take blood thinner any more just take a '81mg'$  aspirin a day. It was just sent in by you today.

## 2021-09-10 NOTE — Telephone Encounter (Signed)
FMLA faxed. Patient notified

## 2021-09-10 NOTE — Telephone Encounter (Signed)
Patient notified

## 2021-09-11 ENCOUNTER — Telehealth: Payer: Self-pay

## 2021-09-11 NOTE — Telephone Encounter (Signed)
Called to let patient know we received his medical clearance he is clear to have procedure

## 2021-09-12 ENCOUNTER — Ambulatory Visit
Admission: RE | Admit: 2021-09-12 | Discharge: 2021-09-12 | Disposition: A | Discharge status: Home / Self Care | Payer: 59 | Source: Ambulatory Visit | Attending: Neurology | Admitting: Neurology

## 2021-09-12 DIAGNOSIS — G459 Transient cerebral ischemic attack, unspecified: Secondary | ICD-10-CM | POA: Diagnosis not present

## 2021-09-17 ENCOUNTER — Other Ambulatory Visit: Payer: Self-pay | Admitting: Nurse Practitioner

## 2021-09-17 ENCOUNTER — Ambulatory Visit: Payer: Self-pay

## 2021-09-17 DIAGNOSIS — E782 Mixed hyperlipidemia: Secondary | ICD-10-CM

## 2021-09-17 NOTE — Telephone Encounter (Signed)
  Chief Complaint: HTN Symptoms: None Frequency: ongoing Pertinent Negatives: Patient denies  Disposition: '[]'$ ED /'[]'$ Urgent Care (no appt availability in office) / '[]'$ Appointment(In office/virtual)/ '[]'$  [facility]/ '[]'$ Home Care/ '[]'$ Refused Recommended Disposition /'[]'$ [facility]/ '[x]'$  Follow-up with PCP Additional Notes: Pt states that he has been taking the HCTZ since it was prescribed last week and BP measurements are consistently 165/100. Pt is wondering if provider would like to prescribe something different.  Pt is alos out of Atorvastain which was prescribed for him while he was hospitalized.  Please advise if pt should stay on the statin and if a HTN medication change is needed.  Medications should be sent to [facility].      Reason for Disposition  Systolic BP  >= 016 OR Diastolic >= 553  Answer Assessment - Initial Assessment Questions 1. BLOOD PRESSURE: "What is the blood pressure?" "Did you take at least two measurements 5 minutes apart?"     160's over 100's 2. ONSET: "When did you take your blood pressure?"     eeryday 3. HOW: "How did you obtain the blood pressure?" (e.g., visiting nurse, automatic home BP monitor)     automatic 4. HISTORY: "Do you have a history of high blood pressure?"     yes 5. MEDICATIONS: "Are you taking any medications for blood pressure?" "Have you missed any doses recently?"     no 6. OTHER SYMPTOMS: "Do you have any symptoms?" (e.g., headache, chest pain, blurred vision, difficulty breathing, weakness)      7. PREGNANCY: "Is there any chance you are pregnant?" "When was your last menstrual period?"     na  Protocols used: Blood Pressure - High-A-AH

## 2021-09-17 NOTE — Telephone Encounter (Signed)
Medication Refill - Medication: atorvastatin (LIPITOR) 40 MG tablet   Has the patient contacted their pharmacy? No. (Agent: If no, request that the patient contact the pharmacy for the refill. If patient does not wish to contact the pharmacy document the reason why and proceed with request.) (Agent: If yes, when and what did the pharmacy advise?)  Preferred Pharmacy (with phone number or street name): [id number] [place] [id number] Phone:  [phone] Fax:  [phone] Has the patient been seen for an appointment in the last year OR does the patient have an upcoming appointment? Yes.    Agent: Please be advised that RX refills may take up to 3 business days. We ask that you follow-up with your pharmacy.

## 2021-09-17 NOTE — Telephone Encounter (Signed)
Blood pressure still elevated at 157/110, 165/110. Do you want him back in office to be seen by you for BP

## 2021-09-18 ENCOUNTER — Other Ambulatory Visit: Payer: Self-pay

## 2021-09-18 DIAGNOSIS — E782 Mixed hyperlipidemia: Secondary | ICD-10-CM

## 2021-09-18 MED ORDER — ATORVASTATIN CALCIUM 40 MG PO TABS: 40.0000 mg | ORAL_TABLET | Freq: Every day | ORAL | 3 refills | Status: DC

## 2021-09-18 NOTE — Telephone Encounter (Signed)
Pt made an additional call regarding this FYI

## 2021-09-18 NOTE — Telephone Encounter (Signed)
Requested medications are due for refill today.  unsure  Requested medications are on the active medications list.  yes  Last refill. 08/20/2021 unknown quantity  Future visit scheduled.   yes  Notes to clinic.  Medication list as historical.    Requested Prescriptions  Pending Prescriptions Disp Refills   atorvastatin (LIPITOR) 40 MG tablet      Sig: Take by mouth.     Cardiovascular:  Antilipid - Statins Failed - 09/17/2021  4:18 PM      Failed - Lipid Panel in normal range within the last 12 months    Cholesterol, Total  Date Value Ref Range Status  06/20/2015 203 (H) 100 - 199 mg/dL Final   Cholesterol  Date Value Ref Range Status  10/16/2015 175 125 - 200 mg/dL Final   LDL Calculated  Date Value Ref Range Status  06/20/2015 134 (H) 0 - 99 mg/dL Final   LDL Cholesterol  Date Value Ref Range Status  10/16/2015 108 <130 mg/dL Final    Comment:      Total Cholesterol/HDL Ratio:CHD Risk                        Coronary Heart Disease Risk Table                                        Men       Women          1/2 Average Risk              3.4        3.3              Average Risk              5.0        4.4           2X Average Risk              9.6        7.1           3X Average Risk             23.4       11.0 Use the calculated Patient Ratio above and the CHD Risk table  to determine the patient's CHD Risk.    HDL  Date Value Ref Range Status  10/16/2015 44 >=40 mg/dL Final  06/20/2015 41 >39 mg/dL Final   Triglycerides  Date Value Ref Range Status  10/16/2015 113 <150 mg/dL Final         Passed - Patient is not pregnant      Passed - Valid encounter within last 12 months    Recent Outpatient Visits           2 weeks ago History of TIA (transient ischemic attack)   [facility] Bo Merino, FNP   2 years ago Pure hypercholesterolemia   [place], [place], FNP   5 years ago Hyperlipidemia   [place], MD   6 years ago Hyperlipidemia   [facility] [place] Roselee Nova, MD   6 years ago Annual physical exam   [facility] Roselee Nova, MD       Future Appointments  In 2 months Reece Packer, Myna Hidalgo, [facility], [facility]

## 2021-09-18 NOTE — Telephone Encounter (Signed)
He is to stay on both valsartan and HCTZ and statin and needs to be seen in person for follow up with blood pressure log. I sent a refill for the statin in.

## 2021-09-18 NOTE — Telephone Encounter (Signed)
Left message for patient to come by office for BP check

## 2021-09-19 ENCOUNTER — Ambulatory Visit: Payer: 59

## 2021-09-19 VITALS — BP 134/82

## 2021-09-19 DIAGNOSIS — Z0130 Encounter for examination of blood pressure without abnormal findings: Secondary | ICD-10-CM

## 2021-09-24 ENCOUNTER — Other Ambulatory Visit: Payer: Self-pay | Admitting: Nurse Practitioner

## 2021-09-24 ENCOUNTER — Telehealth: Payer: Self-pay

## 2021-09-24 DIAGNOSIS — I10 Essential (primary) hypertension: Secondary | ICD-10-CM

## 2021-09-24 DIAGNOSIS — E782 Mixed hyperlipidemia: Secondary | ICD-10-CM

## 2021-09-24 MED ORDER — ATORVASTATIN CALCIUM 40 MG PO TABS
40.0000 mg | ORAL_TABLET | Freq: Every day | ORAL | 3 refills | Status: DC
Start: 2021-09-24 — End: 2022-12-11

## 2021-09-24 NOTE — Telephone Encounter (Unsigned)
Copied from [place] [id number]. Topic: General - Inquiry >> Sep 24, 2021  8:28 AM Marcellus Scott wrote: Reason for CRM: Hope from [facility] clearance hasn't received clearance in regards to Plavix and needs to know when he can stop taking it. Hope mentioned that if she does not receive Plavix clearance form pt procedure will have to be rescheduled.  Pt procedure is scheduled for June 26.  Fax- [phone]

## 2021-09-24 NOTE — Telephone Encounter (Signed)
Called and left message for [facility] to fax paperwork to me to have signed regarding his surgery

## 2021-09-24 NOTE — Telephone Encounter (Signed)
Called patient about blood thinner clearance hes not taking plavix anymore and is good for procedure

## 2021-09-24 NOTE — Telephone Encounter (Signed)
Called asking for the plavix clearance wating for them to get back with me

## 2021-09-24 NOTE — Telephone Encounter (Signed)
Medication Refill - Medication:  atorvastatin (LIPITOR) 40 MG tablet   Has the patient contacted their pharmacy? Yes.   Contact PCP  Preferred Pharmacy (with phone number or street name):  [facility] [id number]  [place], [place] [address]  Phone:  [phone]  Fax:  [phone]   Has the patient been seen for an appointment in the last year OR does the patient have an upcoming appointment? Yes.    Agent: Please be advised that RX refills may take up to 3 business days. We ask that you follow-up with your pharmacy.

## 2021-09-25 NOTE — Telephone Encounter (Signed)
Requested Prescriptions  Pending Prescriptions Disp Refills  . valsartan (DIOVAN) 80 MG tablet [Pharmacy Med Name: VALSARTAN 80 MG TABLET] 90 tablet 0    Sig: TAKE 1 TABLET BY MOUTH DAILY     Cardiovascular:  Angiotensin Receptor Blockers Failed - 09/24/2021  8:40 AM      Failed - Cr in normal range and within 180 days    Creat  Date Value Ref Range Status  10/16/2015 1.02 0.70 - 1.33 mg/dL Final    Comment:      For patients > or = 61 years of age: The upper reference limit for Creatinine is approximately 13% higher for people identified as African-American.      Creatinine, Ser  Date Value Ref Range Status  08/18/2018 1.06 0.61 - 1.24 mg/dL Final         Failed - K in normal range and within 180 days    Potassium  Date Value Ref Range Status  08/18/2018 4.0 3.5 - 5.1 mmol/L Final         Passed - Patient is not pregnant      Passed - Last BP in normal range    BP Readings from Last 1 Encounters:  09/19/21 134/82         Passed - Valid encounter within last 6 months    Recent Outpatient Visits          3 weeks ago History of TIA (transient ischemic attack)   [facility] Bo Merino, FNP   2 years ago Pure hypercholesterolemia   [place], [place], [place]   5 years ago Hyperlipidemia   [place], MD   6 years ago Hyperlipidemia   [place], MD   6 years ago Annual physical exam   [facility] Roselee Nova, MD      Future Appointments            In 2 months Reece Packer, Myna Hidalgo, [facility], [facility]

## 2021-09-28 ENCOUNTER — Ambulatory Visit: Payer: 59 | Admitting: Nurse Practitioner

## 2021-10-01 ENCOUNTER — Ambulatory Visit: Payer: 59 | Admitting: Anesthesiology

## 2021-10-01 ENCOUNTER — Encounter
Admission: RE | Disposition: A | Discharge status: Home / Self Care | Payer: Self-pay | Source: Home / Self Care | Attending: Gastroenterology

## 2021-10-01 ENCOUNTER — Ambulatory Visit
Admission: RE | Admit: 2021-10-01 | Discharge: 2021-10-01 | Disposition: A | Discharge status: Home / Self Care | Payer: 59 | Attending: Gastroenterology | Admitting: Gastroenterology

## 2021-10-01 DIAGNOSIS — Z8673 Personal history of transient ischemic attack (TIA), and cerebral infarction without residual deficits: Secondary | ICD-10-CM | POA: Diagnosis not present

## 2021-10-01 DIAGNOSIS — Z1211 Encounter for screening for malignant neoplasm of colon: Secondary | ICD-10-CM | POA: Insufficient documentation

## 2021-10-01 DIAGNOSIS — K644 Residual hemorrhoidal skin tags: Secondary | ICD-10-CM | POA: Insufficient documentation

## 2021-10-01 DIAGNOSIS — Z86010 Personal history of colonic polyps: Secondary | ICD-10-CM | POA: Insufficient documentation

## 2021-10-01 DIAGNOSIS — K5730 Diverticulosis of large intestine without perforation or abscess without bleeding: Secondary | ICD-10-CM | POA: Diagnosis not present

## 2021-10-01 DIAGNOSIS — D120 Benign neoplasm of cecum: Secondary | ICD-10-CM | POA: Insufficient documentation

## 2021-10-01 DIAGNOSIS — E7800 Pure hypercholesterolemia, unspecified: Secondary | ICD-10-CM | POA: Diagnosis not present

## 2021-10-01 DIAGNOSIS — Z87891 Personal history of nicotine dependence: Secondary | ICD-10-CM | POA: Insufficient documentation

## 2021-10-01 DIAGNOSIS — I10 Essential (primary) hypertension: Secondary | ICD-10-CM | POA: Diagnosis not present

## 2021-10-01 HISTORY — PX: COLONOSCOPY WITH PROPOFOL: SHX5780

## 2021-10-01 SURGERY — COLONOSCOPY WITH PROPOFOL
Anesthesia: General

## 2021-10-01 MED ORDER — SODIUM CHLORIDE 0.9 % IV SOLN: INTRAVENOUS | Status: DC

## 2021-10-01 MED ORDER — LIDOCAINE 2% (20 MG/ML) 5 ML SYRINGE
INTRAMUSCULAR | Status: DC | PRN
  Administered 2021-10-01: 50 mg via INTRAVENOUS

## 2021-10-01 MED ORDER — PROPOFOL 1000 MG/100ML IV EMUL
INTRAVENOUS | Status: AC
  Filled 2021-10-01: qty 100

## 2021-10-01 MED ORDER — LIDOCAINE HCL (PF) 2 % IJ SOLN
INTRAMUSCULAR | Status: AC
  Filled 2021-10-01: qty 5

## 2021-10-01 MED ORDER — PROPOFOL 500 MG/50ML IV EMUL
INTRAVENOUS | Status: DC | PRN
  Administered 2021-10-01: 150 ug/kg/min via INTRAVENOUS

## 2021-10-01 MED ORDER — PROPOFOL 10 MG/ML IV BOLUS
INTRAVENOUS | Status: DC | PRN
  Administered 2021-10-01: 40 mg via INTRAVENOUS
  Administered 2021-10-01: 100 mg via INTRAVENOUS

## 2021-10-02 ENCOUNTER — Other Ambulatory Visit: Payer: Self-pay | Admitting: Nurse Practitioner

## 2021-10-02 ENCOUNTER — Encounter: Payer: Self-pay | Admitting: Gastroenterology

## 2021-10-02 DIAGNOSIS — I10 Essential (primary) hypertension: Secondary | ICD-10-CM

## 2021-10-02 LAB — SURGICAL PATHOLOGY

## 2021-10-04 MED ORDER — HYDROCHLOROTHIAZIDE 12.5 MG PO TABS: 12.5000 mg | ORAL_TABLET | Freq: Every day | ORAL | 1 refills | Status: DC

## 2021-10-04 NOTE — Telephone Encounter (Signed)
Pt is calling back to follow up on medication refill. Pt stated he has four tablets left and is requesting for medication to be sent to the pharmacy below.     [facility] [id number] [place]  [place] [address]  Phone: [phone] Fax: [phone]  Hours: Not open 24 hours

## 2021-10-04 NOTE — Telephone Encounter (Signed)
Requested medication (s) are due for refill today: yes  Requested medication (s) are on the active medication list: yes  Last refill:  09/10/21 #30/0  Future visit scheduled: yes  Notes to clinic:  pt is fu on refill, has 4 pills left. Unable to refill per protocol due to failed labs, no updated results.     Requested Prescriptions  Pending Prescriptions Disp Refills   hydrochlorothiazide (HYDRODIURIL) 12.5 MG tablet 30 tablet 0    Sig: Take 1 tablet (12.5 mg total) by mouth daily.     Cardiovascular: Diuretics - Thiazide Failed - 10/04/2021  3:29 PM      Failed - Cr in normal range and within 180 days    Creat  Date Value Ref Range Status  10/16/2015 1.02 0.70 - 1.33 mg/dL Final    Comment:      For patients > or = 61 years of age: The upper reference limit for Creatinine is approximately 13% higher for people identified as African-American.      Creatinine, Ser  Date Value Ref Range Status  08/18/2018 1.06 0.61 - 1.24 mg/dL Final         Failed - K in normal range and within 180 days    Potassium  Date Value Ref Range Status  08/18/2018 4.0 3.5 - 5.1 mmol/L Final         Failed - Na in normal range and within 180 days    Sodium  Date Value Ref Range Status  08/18/2018 140 135 - 145 mmol/L Final  06/20/2015 141 134 - 144 mmol/L Final         Passed - Last BP in normal range    BP Readings from Last 1 Encounters:  10/01/21 132/72         Passed - Valid encounter within last 6 months    Recent Outpatient Visits           1 month ago History of TIA (transient ischemic attack)   [facility] Bo Merino, FNP   2 years ago Pure hypercholesterolemia   [place], Astrid Divine, FNP   5 years ago Hyperlipidemia   [place], MD   6 years ago Hyperlipidemia   [place], MD   6 years ago Annual physical exam   [place], [place], MD       Future Appointments             In 1 month Reece Packer, Myna Hidalgo, [facility], [place] Prescriptions Disp Refills   hydrochlorothiazide (HYDRODIURIL) 12.5 MG tablet [Pharmacy Med Name: HYDROCHLOROTHIAZIDE 12.5 MG TB] 30 tablet 0    Sig: TAKE 1 TABLET BY MOUTH EVERY DAY     Cardiovascular: Diuretics - Thiazide Failed - 10/04/2021  3:29 PM      Failed - Cr in normal range and within 180 days    Creat  Date Value Ref Range Status  10/16/2015 1.02 0.70 - 1.33 mg/dL Final    Comment:      For patients > or = 61 years of age: The upper reference limit for Creatinine is approximately 13% higher for people identified as African-American.      Creatinine, Ser  Date Value Ref Range Status  08/18/2018 1.06 0.61 - 1.24 mg/dL Final  Failed - K in normal range and within 180 days    Potassium  Date Value Ref Range Status  08/18/2018 4.0 3.5 - 5.1 mmol/L Final         Failed - Na in normal range and within 180 days    Sodium  Date Value Ref Range Status  08/18/2018 140 135 - 145 mmol/L Final  06/20/2015 141 134 - 144 mmol/L Final         Passed - Last BP in normal range    BP Readings from Last 1 Encounters:  10/01/21 132/72         Passed - Valid encounter within last 6 months    Recent Outpatient Visits           1 month ago History of TIA (transient ischemic attack)   [facility] Bo Merino, FNP   2 years ago Pure hypercholesterolemia   [place], FNP   5 years ago Hyperlipidemia   [place], MD   6 years ago Hyperlipidemia   [place], MD   6 years ago Annual physical exam   [facility] Roselee Nova, MD       Future Appointments             In 1 month Reece Packer, Myna Hidalgo, [facility], [facility]

## 2021-10-04 NOTE — Addendum Note (Signed)
Addended by: Erie Noe on: 10/04/2021 03:29 PM   Modules accepted: Orders

## 2021-10-26 ENCOUNTER — Other Ambulatory Visit: Payer: Self-pay | Admitting: Internal Medicine

## 2021-10-26 ENCOUNTER — Other Ambulatory Visit (HOSPITAL_COMMUNITY): Payer: Self-pay | Admitting: *Deleted

## 2021-10-26 ENCOUNTER — Encounter (HOSPITAL_COMMUNITY): Payer: Self-pay

## 2021-10-26 ENCOUNTER — Telehealth (HOSPITAL_COMMUNITY): Payer: Self-pay | Admitting: *Deleted

## 2021-10-26 DIAGNOSIS — R079 Chest pain, unspecified: Secondary | ICD-10-CM

## 2021-10-26 MED ORDER — METOPROLOL TARTRATE 100 MG PO TABS: ORAL_TABLET | ORAL | 0 refills | Status: DC

## 2021-10-26 NOTE — Telephone Encounter (Signed)
Attempted to call patient regarding upcoming cardiac CT appointment. °Left message on voicemail with name and callback number ° °Donalee Gaumond RN Navigator Cardiac Imaging °[facility] °[phone] Office °[phone] Cell ° °

## 2021-10-29 ENCOUNTER — Ambulatory Visit
Admission: RE | Admit: 2021-10-29 | Discharge: 2021-10-29 | Disposition: A | Discharge status: Home / Self Care | Payer: 59 | Source: Ambulatory Visit | Attending: Internal Medicine | Admitting: Internal Medicine

## 2021-10-29 ENCOUNTER — Other Ambulatory Visit: Payer: Self-pay | Admitting: Cardiology

## 2021-10-29 ENCOUNTER — Ambulatory Visit
Admission: RE | Admit: 2021-10-29 | Discharge: 2021-10-29 | Disposition: A | Discharge status: Home / Self Care | Payer: 59 | Source: Ambulatory Visit | Attending: Cardiology | Admitting: Cardiology

## 2021-10-29 DIAGNOSIS — R931 Abnormal findings on diagnostic imaging of heart and coronary circulation: Secondary | ICD-10-CM

## 2021-10-29 DIAGNOSIS — I2510 Atherosclerotic heart disease of native coronary artery without angina pectoris: Secondary | ICD-10-CM

## 2021-10-29 DIAGNOSIS — R079 Chest pain, unspecified: Secondary | ICD-10-CM | POA: Diagnosis present

## 2021-10-29 LAB — POCT I-STAT CREATININE: Creatinine, Ser: 1.2 mg/dL (ref 0.61–1.24)

## 2021-10-29 MED ORDER — IOHEXOL 350 MG/ML SOLN
100.0000 mL | Freq: Once | INTRAVENOUS | Status: AC | PRN
  Administered 2021-10-29: 100 mL via INTRAVENOUS

## 2021-10-29 MED ORDER — NITROGLYCERIN 0.4 MG SL SUBL: 0.8000 mg | SUBLINGUAL_TABLET | Freq: Once | SUBLINGUAL | Status: AC

## 2021-10-29 MED ORDER — NITROGLYCERIN 0.4 MG SL SUBL
SUBLINGUAL_TABLET | SUBLINGUAL | Status: AC
  Administered 2021-10-29: 0.8 mg via SUBLINGUAL
  Filled 2021-10-29: qty 2

## 2021-10-29 NOTE — Progress Notes (Signed)
Patient tolerated procedure well. Ambulate w/o difficulty. Denies any lightheadedness or being dizzy. Pt denies any pain at this time. Sitting in chair, drinking water provided. Pt is encouraged to drink additional water throughout the day and reason explained to patient. Patient verbalized understanding and all questions answered. ABC intact. No further needs at this time. Discharge from procedure area w/o issues.  

## 2021-10-30 ENCOUNTER — Ambulatory Visit
Admission: RE | Admit: 2021-10-30 | Discharge: 2021-10-30 | Disposition: A | Discharge status: Home / Self Care | Payer: 59 | Source: Ambulatory Visit | Attending: Cardiology | Admitting: Cardiology

## 2021-10-30 DIAGNOSIS — R931 Abnormal findings on diagnostic imaging of heart and coronary circulation: Secondary | ICD-10-CM

## 2021-10-30 DIAGNOSIS — R079 Chest pain, unspecified: Secondary | ICD-10-CM | POA: Diagnosis not present

## 2021-11-04 ENCOUNTER — Telehealth: Payer: Self-pay | Admitting: Nurse Practitioner

## 2021-11-04 DIAGNOSIS — I10 Essential (primary) hypertension: Secondary | ICD-10-CM

## 2021-11-06 ENCOUNTER — Other Ambulatory Visit: Payer: Self-pay

## 2021-11-06 DIAGNOSIS — I10 Essential (primary) hypertension: Secondary | ICD-10-CM

## 2021-11-06 MED ORDER — HYDROCHLOROTHIAZIDE 12.5 MG PO TABS: 12.5000 mg | ORAL_TABLET | Freq: Every day | ORAL | 1 refills | Status: DC

## 2021-11-06 NOTE — Telephone Encounter (Signed)
Rx 10/04/21 #30 1RF- too soon Requested Prescriptions  Pending Prescriptions Disp Refills  . hydrochlorothiazide (HYDRODIURIL) 12.5 MG tablet [Pharmacy Med Name: hydroCHLOROthiazide 12.5 MG TABLET] 30 tablet 1    Sig: TAKE 1 TABLET BY MOUTH DAILY     Cardiovascular: Diuretics - Thiazide Failed - 11/04/2021  2:56 PM      Failed - K in normal range and within 180 days    Potassium  Date Value Ref Range Status  08/18/2018 4.0 3.5 - 5.1 mmol/L Final         Failed - Na in normal range and within 180 days    Sodium  Date Value Ref Range Status  08/18/2018 140 135 - 145 mmol/L Final  06/20/2015 141 134 - 144 mmol/L Final         Failed - Last BP in normal range    BP Readings from Last 1 Encounters:  10/29/21 (!) 159/92         Passed - Cr in normal range and within 180 days    Creat  Date Value Ref Range Status  10/16/2015 1.02 0.70 - 1.33 mg/dL Final    Comment:      For patients > or = 61 years of age: The upper reference limit for Creatinine is approximately 13% higher for people identified as African-American.      Creatinine, Ser  Date Value Ref Range Status  10/29/2021 1.20 0.61 - 1.24 mg/dL Final         Passed - Valid encounter within last 6 months    Recent Outpatient Visits          2 months ago History of TIA (transient ischemic attack)   [facility] Bo Merino, FNP   2 years ago Pure hypercholesterolemia   [place], [place], FNP   6 years ago Hyperlipidemia   [place], MD   6 years ago Hyperlipidemia   [place], MD   6 years ago Annual physical exam   [facility] Roselee Nova, MD      Future Appointments            In 3 weeks Reece Packer, Myna Hidalgo, [facility], [facility]

## 2021-11-29 ENCOUNTER — Ambulatory Visit (INDEPENDENT_AMBULATORY_CARE_PROVIDER_SITE_OTHER): Payer: 59 | Admitting: Nurse Practitioner

## 2021-11-29 ENCOUNTER — Other Ambulatory Visit: Payer: Self-pay

## 2021-11-29 ENCOUNTER — Encounter: Payer: Self-pay | Admitting: Nurse Practitioner

## 2021-11-29 VITALS — BP 158/86 | HR 86 | Temp 98.2°F | Resp 16 | Ht 69.25 in | Wt 230.0 lb

## 2021-11-29 DIAGNOSIS — E669 Obesity, unspecified: Secondary | ICD-10-CM | POA: Insufficient documentation

## 2021-11-29 DIAGNOSIS — I10 Essential (primary) hypertension: Secondary | ICD-10-CM

## 2021-11-29 DIAGNOSIS — E782 Mixed hyperlipidemia: Secondary | ICD-10-CM | POA: Diagnosis not present

## 2021-11-29 DIAGNOSIS — I209 Angina pectoris, unspecified: Secondary | ICD-10-CM | POA: Insufficient documentation

## 2021-11-29 DIAGNOSIS — Z8673 Personal history of transient ischemic attack (TIA), and cerebral infarction without residual deficits: Secondary | ICD-10-CM | POA: Diagnosis not present

## 2021-11-29 DIAGNOSIS — R002 Palpitations: Secondary | ICD-10-CM | POA: Insufficient documentation

## 2021-11-29 MED ORDER — VALSARTAN-HYDROCHLOROTHIAZIDE 160-12.5 MG PO TABS: 1.0000 | ORAL_TABLET | Freq: Every day | ORAL | 0 refills | Status: DC

## 2021-11-29 NOTE — Assessment & Plan Note (Signed)
Continue working on physical activity and healthy eating.

## 2021-11-29 NOTE — Assessment & Plan Note (Signed)
Patient's blood pressure still elevated today.  We will change patient's blood pressure medication valsartan 80 mg daily and hydrochlorothiazide 12.5 mg daily to combination valsartan-hydrochlorothiazide 160-12.5 mg daily.  Patient to continue taking diltiazem 240 mg daily.  We will keep pressure log and send in my chart.

## 2021-11-29 NOTE — Progress Notes (Signed)
BP (!) 158/86   Pulse 86   Temp 98.2 F (36.8 C) (Oral)   Resp 16   Ht 5' 9.25" (1.759 m)   Wt 230 lb (104.3 kg)   SpO2 95%   BMI 33.72 kg/m    Subjective:    Patient ID: Douglas Friedman, male    DOB: April 07, 1961, 61 y.o.   MRN: [id number]  HPI: Douglas Friedman is a 61 y.o. male  Chief Complaint  Patient presents with   Hypertension   Hyperlipidemia    3 month follow up   Hypertension: Patient's blood pressure today is 160/88, his recheck was 158/86.  His blood pressure at cardiology's office on 10/26/2021 was 134/80.  Patient is currently taking valsartan 80 mg daily, hydrochlorothiazide 12.5 mg daily and started on diltiazem 240 mg daily a month ago.  Patient states his blood pressure has been running high at home.  Patient denies any chest pain, shortness of breath, headaches or blurred vision.  Discussed increasing his blood pressure medication.  We will increase patient's valsartan to 160 mg and continue hydrochlorothiazide 12.5 mg daily, given as a combo medication (Diovan-HCT).  Patient will continue to monitor blood pressure and send readings through MyChart.  Hyperlipidemia: His last LDL was 80 on 08/20/2021.  Patient currently takes atorvastatin 40 mg daily.  Patient denies any myalgia.  We will continue with current treatment.  History of TIA: Patient had a TIA on 08/20/2021.  He had experienced left-sided weakness, numbness and loss of vision in the left eye that lasted for about 6 minutes.  His blood pressure was elevated 190/120 with a heart rate of 84.  It was referred to Dr. Manuella Ghazi neurology which she saw on 09/07/2021.   He had a repeat MRI on 09/12/2021 which showed: 1. No acute or subacute insult. 2. 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  Patient reports he is doing well and has had no other issues.  Obesity: His current weight is 230 pounds with a BMI of 33.72.  Patient states that he has been walking about an hour  every day.  States he is also watching what he eats.  Angina pectoris/palpitations: Saw Dr. Clayborn Bigness, cardiology, on 10/26/2021.  He was started on diltiazem 240 mg daily.  Patient denies any chest pain.  Patient states he does feel a tightness in his chest when he has increased anxiety.  Patient states it goes away after he takes a couple deep breaths.  Relevant past medical, surgical, family and social history reviewed and updated as indicated. Interim medical history since our last visit reviewed. Allergies and medications reviewed and updated.  Review of Systems  Constitutional: Negative for fever or weight change.  Respiratory: Negative for cough and shortness of breath.   Cardiovascular: Negative for chest pain or palpitations.  Gastrointestinal: Negative for abdominal pain, no bowel changes.  Musculoskeletal: Negative for gait problem or joint swelling.  Skin: Negative for rash.  Neurological: Negative for dizziness or headache.  No other specific complaints in a complete review of systems (except as listed in HPI above).      Objective:    BP (!) 158/86   Pulse 86   Temp 98.2 F (36.8 C) (Oral)   Resp 16   Ht 5' 9.25" (1.759 m)   Wt 230 lb (104.3 kg)   SpO2 95%   BMI 33.72 kg/m   Wt Readings from Last 3 Encounters:  11/29/21 230 lb (  104.3 kg)  10/01/21 225 lb (102.1 kg)  08/29/21 230 lb 8 oz (104.6 kg)    Physical Exam  Constitutional: Patient appears well-developed and well-nourished. Obese  No distress.  HEENT: head atraumatic, normocephalic, pupils equal and reactive to light, neck supple Cardiovascular: Normal rate, regular rhythm and normal heart sounds.  No murmur heard. No BLE edema. Pulmonary/Chest: Effort normal and breath sounds normal. No respiratory distress. Abdominal: Soft.  There is no tenderness. Psychiatric: Patient has a normal mood and affect. behavior is normal. Judgment and thought content normal.  Results for orders placed or performed during  the hospital encounter of 10/29/21  I-STAT creatinine  Result Value Ref Range   Creatinine, Ser 1.20 0.61 - 1.24 mg/dL      Assessment & Plan:   Problem List Items Addressed This Visit       Cardiovascular and Mediastinum   Hypertension - Primary    Patient's blood pressure still elevated today.  We will change patient's blood pressure medication valsartan 80 mg daily and hydrochlorothiazide 12.5 mg daily to combination valsartan-hydrochlorothiazide 160-12.5 mg daily.  Patient to continue taking diltiazem 240 mg daily.  We will keep pressure log and send in my chart.      Relevant Medications   diltiazem (CARDIZEM CD) 240 MG 24 hr capsule   valsartan-hydrochlorothiazide (DIOVAN-HCT) 160-12.5 MG tablet   Angina pectoris ([place])    He  denies any chest pain.  Patient was placed on diltiazem 240 mg daily by cardiology.  We will continue with current treatment plan.      Relevant Medications   diltiazem (CARDIZEM CD) 240 MG 24 hr capsule   valsartan-hydrochlorothiazide (DIOVAN-HCT) 160-12.5 MG tablet     Other   Hyperlipidemia    Continue taking atorvastatin 40 mg daily.      Relevant Medications   diltiazem (CARDIZEM CD) 240 MG 24 hr capsule   valsartan-hydrochlorothiazide (DIOVAN-HCT) 160-12.5 MG tablet   History of TIA (transient ischemic attack)    Patient doing well.  Continues to follow with Dr. Manuella Ghazi neurology.      Obesity (BMI 30-39.9)    Continue working on physical activity and healthy eating.      Heart palpitations    He  denies any chest pain.  Patient was placed on diltiazem 240 mg daily by cardiology.  We will continue with current treatment plan.        Follow up plan: Return in about 3 months (around 03/01/2022) for follow up.

## 2021-11-29 NOTE — Assessment & Plan Note (Signed)
He  denies any chest pain.  Patient was placed on diltiazem 240 mg daily by cardiology.  We will continue with current treatment plan.

## 2021-11-29 NOTE — Assessment & Plan Note (Signed)
Patient doing well.  Continues to follow with Dr. Manuella Ghazi neurology.

## 2021-11-29 NOTE — Assessment & Plan Note (Signed)
Continue taking atorvastatin 40 mg daily. 

## 2021-11-30 ENCOUNTER — Other Ambulatory Visit: Payer: Self-pay | Admitting: Nurse Practitioner

## 2021-11-30 DIAGNOSIS — I10 Essential (primary) hypertension: Secondary | ICD-10-CM

## 2021-11-30 NOTE — Telephone Encounter (Signed)
Pt called, LVMTCB to clarify which pharmacy using. Rx was sent to [facility] on 11-06-21.

## 2021-11-30 NOTE — Telephone Encounter (Signed)
Pt returned Kayl's call / he no longer takes Hydrochlorothiazide and was sent a new RX for the combination valsartan-hydrochlorothiazide (DIOVAN-HCT) 160-12.5 MG tablet /so he will not need the refill that was requested for Hydrochlorothiazide  He uses [facility] for med refills

## 2021-11-30 NOTE — Telephone Encounter (Signed)
Pt states he doesn't need refill as new combo medication was sent into pharmacy on 11/29/21 Requested Prescriptions  Pending Prescriptions Disp Refills  . hydrochlorothiazide (HYDRODIURIL) 12.5 MG tablet [Pharmacy Med Name: hydroCHLOROthiazide 12.5 MG TABLET] 30 tablet     Sig: TAKE 1 TABLET BY MOUTH DAILY     Cardiovascular: Diuretics - Thiazide Failed - 11/30/2021 11:16 AM      Failed - K in normal range and within 180 days    Potassium  Date Value Ref Range Status  08/18/2018 4.0 3.5 - 5.1 mmol/L Final         Failed - Na in normal range and within 180 days    Sodium  Date Value Ref Range Status  08/18/2018 140 135 - 145 mmol/L Final  06/20/2015 141 134 - 144 mmol/L Final         Failed - Last BP in normal range    BP Readings from Last 1 Encounters:  11/29/21 (!) 158/86         Passed - Cr in normal range and within 180 days    Creat  Date Value Ref Range Status  10/16/2015 1.02 0.70 - 1.33 mg/dL Final    Comment:      For patients > or = 61 years of age: The upper reference limit for Creatinine is approximately 13% higher for people identified as African-American.      Creatinine, Ser  Date Value Ref Range Status  10/29/2021 1.20 0.61 - 1.24 mg/dL Final         Passed - Valid encounter within last 6 months    Recent Outpatient Visits          Yesterday Hypertension, unspecified type   [place], FNP   3 months ago History of TIA (transient ischemic attack)   [facility] Bo Merino, FNP   2 years ago Pure hypercholesterolemia   [place], [place], FNP   6 years ago Hyperlipidemia   [place], MD   6 years ago Hyperlipidemia   [place], MD      Future Appointments            In 2 months Reece Packer, Myna Hidalgo, [facility], [facility]

## 2021-12-03 ENCOUNTER — Ambulatory Visit (INDEPENDENT_AMBULATORY_CARE_PROVIDER_SITE_OTHER): Payer: 59 | Admitting: Nurse Practitioner

## 2021-12-03 ENCOUNTER — Encounter: Payer: Self-pay | Admitting: Nurse Practitioner

## 2021-12-03 ENCOUNTER — Ambulatory Visit: Payer: Self-pay | Admitting: *Deleted

## 2021-12-03 VITALS — BP 170/100 | HR 70 | Temp 98.4°F | Resp 16 | Ht 69.25 in | Wt 232.3 lb

## 2021-12-03 DIAGNOSIS — R0789 Other chest pain: Secondary | ICD-10-CM | POA: Diagnosis not present

## 2021-12-03 DIAGNOSIS — F419 Anxiety disorder, unspecified: Secondary | ICD-10-CM | POA: Diagnosis not present

## 2021-12-03 DIAGNOSIS — I10 Essential (primary) hypertension: Secondary | ICD-10-CM

## 2021-12-03 DIAGNOSIS — Z0130 Encounter for examination of blood pressure without abnormal findings: Secondary | ICD-10-CM

## 2021-12-03 MED ORDER — NEBIVOLOL HCL 5 MG PO TABS: 5.0000 mg | ORAL_TABLET | Freq: Every day | ORAL | 0 refills | Status: DC

## 2021-12-03 MED ORDER — ESCITALOPRAM OXALATE 10 MG PO TABS: 10.0000 mg | ORAL_TABLET | Freq: Every day | ORAL | 0 refills | Status: DC

## 2021-12-03 NOTE — Assessment & Plan Note (Signed)
Continue taking valsartan-hydrochlorothiazide 160-12.5 mg daily, and diltiazem 240 mg daily.  Start taking Bystolic 5 mg daily.  Follow-up in 2 days for blood pressure check.

## 2021-12-03 NOTE — Telephone Encounter (Signed)
  Chief Complaint: HTN Symptoms: changed med Thursday at appt with Serafina Royals, FNP Frequency: several times this morning Pertinent Negatives: Patient denies symptoms at present stated chest tightness for a brief time when pressure 174/118 Disposition: '[]'$ ED /'[]'$ Urgent Care (no appt availability in office) / '[]'$ Appointment(In office/virtual)/ '[]'$  [place] [facility]/ '[]'$ Home Care/ '[]'$ Refused Recommended Disposition /'[]'$ [place] [facility]/ '[x]'$  Follow-up with PCP Additional Notes: Just seen by Serafina Royals 4 days ago and BP meds changed. Instructed to call if any changes, he expected it to go too low, but has actually gone higher than it was. Reviewed meds with pt and he is taking as Almyra Free instructed, started Thursday afternoon. Please advise.   Reason for Disposition  Ran out of BP medications  Answer Assessment - Initial Assessment Questions 1. BLOOD PRESSURE: "What is the blood pressure?" "Did you take at least two measurements 5 minutes apart?"     174/118, 166/100 2. ONSET: "When did you take your blood pressure?"     Multiple times this morning 3. HOW: "How did you take your blood pressure?" (e.g., automatic home BP monitor, visiting nurse)     Cuff on arm (large cuff) 4. HISTORY: "Do you have a history of high blood pressure?"     Yes, changed medication dose Thursday 5. MEDICINES: "Are you taking any medicines for blood pressure?" "Have you missed any doses recently?"     no 6. OTHER SYMPTOMS: "Do you have any symptoms?" (e.g., blurred vision, chest pain, difficulty breathing, headache, weakness)     Slight chest tightness briefly early this morning when it was 174/118. 7. PREGNANCY: "Is there any chance you are pregnant?" "When was your last menstrual period?"     Na  Protocols used: Blood Pressure - High-A-AH

## 2021-12-03 NOTE — Assessment & Plan Note (Signed)
Start taking Lexapro 10 mg daily follow-up in 4 weeks.

## 2021-12-03 NOTE — Telephone Encounter (Signed)
Patient coming bt ay 3

## 2021-12-03 NOTE — Telephone Encounter (Signed)
FYI

## 2021-12-03 NOTE — Progress Notes (Signed)
BP (!) 170/100   Pulse 70   Temp 98.4 F (36.9 C) (Oral)   Resp 16   Ht 5' 9.25" (1.759 m)   Wt 232 lb 4.8 oz (105.4 kg)   SpO2 96%   BMI 34.06 kg/m    Subjective:    Patient ID: Douglas Friedman, male    DOB: 1960/12/20, 61 y.o.   MRN: [id number]  HPI: Douglas Friedman is a 61 y.o. male  No chief complaint on file.  Hypertension/ blood pressure check/ chest tightness: Patient was here for blood pressure check and his blood pressure was elevated at 170/100.  Patient  was recently seen on 11/29/2021.  His blood pressure medication was increased to valsartan-hydrochlorothiazide 160-12.5 mg.  Patient states over the weekend his blood pressure was elevated.  He says on Saturday he has blood pressure was 174/118.  Patient states at that time he was having some chest tightness.  She denies any chest pain or chest tightness at this time.  States today he has a headache.  We will continue patient on valsartan-hydrochlorothiazide 160-12.5 mg daily, continue taking Cardizem 240 mg daily and start taking Bystolic 5 mg daily.  EKG performed which showed no ST elevation or depression.   Anxiety:  Patient has been having a lot of anxiety with his health and stress with disability and FMLA.  Patient states has been dealing with this anxiety for a while now.  Discussed medication options with patient.  Recommend starting Lexapro 10 mg daily.  Patient is in agreement with plan.    12/03/2021    3:37 PM 08/29/2021    2:46 PM  GAD 7 : Generalized Anxiety Score  Nervous, Anxious, on Edge 3 3  Control/stop worrying 3 3  Worry too much - different things 3 3  Trouble relaxing 2 3  Restless 2 0  Easily annoyed or irritable 2 3  Afraid - awful might happen 2 0  Total GAD 7 Score 17 15  Anxiety Difficulty  Not difficult at all        12/03/2021    3:37 PM 12/03/2021    3:16 PM 11/29/2021    8:03 AM 08/29/2021    2:45 PM 03/23/2019   10:39 AM  Depression screen PHQ 2/9  Decreased Interest 0 0 0 2 0   Down, Depressed, Hopeless 0 0 0 2 0  PHQ - 2 Score 0 0 0 4 0  Altered sleeping 0 0  0 0  Tired, decreased energy 1 0  1 0  Change in appetite 0 0  2 0  Feeling bad or failure about yourself  1 0  0 0  Trouble concentrating 0 0  1 0  Moving slowly or fidgety/restless 2 0  0 0  Suicidal thoughts 0 0  0 0  PHQ-9 Score 4 0  8 0  Difficult doing work/chores Not difficult at all Not difficult at all  Somewhat difficult Not difficult at all     Relevant past medical, surgical, family and social history reviewed and updated as indicated. Interim medical history since our last visit reviewed. Allergies and medications reviewed and updated.  Review of Systems  Constitutional: Negative for fever or weight change.  Respiratory: Negative for cough and shortness of breath.   Cardiovascular: Negative for chest pain or palpitations.  Gastrointestinal: Negative for abdominal pain, no bowel changes.  Musculoskeletal: Negative for gait problem or joint swelling.  Skin: Negative for rash.  Neurological: Negative for dizziness, positive for  headache.  No other specific complaints in a complete review of systems (except as listed in HPI above).      Objective:    BP (!) 170/100   Pulse 70   Temp 98.4 F (36.9 C) (Oral)   Resp 16   Ht 5' 9.25" (1.759 m)   Wt 232 lb 4.8 oz (105.4 kg)   SpO2 96%   BMI 34.06 kg/m   Wt Readings from Last 3 Encounters:  12/03/21 232 lb 4.8 oz (105.4 kg)  11/29/21 230 lb (104.3 kg)  10/01/21 225 lb (102.1 kg)    Physical Exam Constitutional: Patient appears well-developed and well-nourished. Obese  No distress.  HEENT: head atraumatic, normocephalic, pupils equal and reactive to light, neck supple Cardiovascular: Normal rate, regular rhythm and normal heart sounds.  No murmur heard. No BLE edema. Pulmonary/Chest: Effort normal and breath sounds normal. No respiratory distress. Abdominal: Soft.  There is no tenderness. Psychiatric: Patient has a normal mood  and affect. behavior is normal. Judgment and thought content normal.  Results for orders placed or performed during the hospital encounter of 10/29/21  I-STAT creatinine  Result Value Ref Range   Creatinine, Ser 1.20 0.61 - 1.24 mg/dL      Assessment & Plan:   Problem List Items Addressed This Visit       Cardiovascular and Mediastinum   Hypertension - Primary    Continue taking valsartan-hydrochlorothiazide 160-12.5 mg daily, and diltiazem 240 mg daily.  Start taking Bystolic 5 mg daily.  Follow-up in 2 days for blood pressure check.      Relevant Medications   nebivolol (BYSTOLIC) 5 MG tablet   Other Relevant Orders   Aldosterone + renin activity w/ ratio   VAS US RENAL ARTERY DUPLEX   Microalbumin / creatinine urine ratio   EKG 12-Lead   CBC with Differential/Platelet   COMPLETE METABOLIC PANEL WITH GFR     Other   Anxiety    Start taking Lexapro 10 mg daily follow-up in 4 weeks.      Relevant Medications   escitalopram (LEXAPRO) 10 MG tablet   Other Visit Diagnoses     Blood pressure check       Pressure elevated patient put on my schedule.   Chest tightness       Currently having chest tightness.  EKG performed which showed no ST elevation or depression.   Relevant Orders   EKG 12-Lead        Follow up plan: Return in about 4 weeks (around 12/31/2021) for follow up, nurse visit 2 days for blood pressure check.

## 2021-12-05 ENCOUNTER — Ambulatory Visit: Payer: 59

## 2021-12-05 ENCOUNTER — Other Ambulatory Visit: Payer: Self-pay | Admitting: Nurse Practitioner

## 2021-12-05 VITALS — BP 162/90

## 2021-12-05 DIAGNOSIS — Z0130 Encounter for examination of blood pressure without abnormal findings: Secondary | ICD-10-CM

## 2021-12-05 DIAGNOSIS — I10 Essential (primary) hypertension: Secondary | ICD-10-CM

## 2021-12-07 ENCOUNTER — Encounter: Payer: Self-pay | Admitting: Nurse Practitioner

## 2021-12-07 LAB — CALCIUM, IONIZED: Calcium, Ion: 5.5 mg/dL (ref 4.7–5.5)

## 2021-12-07 LAB — PARATHYROID HORMONE, INTACT (NO CA): PTH: 57 pg/mL (ref 16–77)

## 2021-12-08 LAB — COMPLETE METABOLIC PANEL WITH GFR
AG Ratio: 1.4 (calc) (ref 1.0–2.5)
ALT: 23 U/L (ref 9–46)
AST: 20 U/L (ref 10–35)
Albumin: 4.2 g/dL (ref 3.6–5.1)
Alkaline phosphatase (APISO): 46 U/L (ref 35–144)
BUN: 17 mg/dL (ref 7–25)
CO2: 27 mmol/L (ref 20–32)
Calcium: 11 mg/dL — ABNORMAL HIGH (ref 8.6–10.3)
Chloride: 104 mmol/L (ref 98–110)
Creat: 1.23 mg/dL (ref 0.70–1.35)
Globulin: 3.1 g/dL (calc) (ref 1.9–3.7)
Glucose, Bld: 83 mg/dL (ref 65–99)
Potassium: 4.6 mmol/L (ref 3.5–5.3)
Sodium: 142 mmol/L (ref 135–146)
Total Bilirubin: 0.4 mg/dL (ref 0.2–1.2)
Total Protein: 7.3 g/dL (ref 6.1–8.1)
eGFR: 67 mL/min/{1.73_m2} (ref >=60)

## 2021-12-08 LAB — CBC WITH DIFFERENTIAL/PLATELET
Absolute Monocytes: 782 cells/uL (ref 200–950)
Basophils Absolute: 74 cells/uL (ref 0–200)
Basophils Relative: 0.8 %
Eosinophils Absolute: 285 cells/uL (ref 15–500)
Eosinophils Relative: 3.1 %
HCT: 50.6 % — ABNORMAL HIGH (ref 38.5–50.0)
Hemoglobin: 17.7 g/dL — ABNORMAL HIGH (ref 13.2–17.1)
Lymphs Abs: 1647 cells/uL (ref 850–3900)
MCH: 31.4 pg (ref 27.0–33.0)
MCHC: 35 g/dL (ref 32.0–36.0)
MCV: 89.7 fL (ref 80.0–100.0)
MPV: 10.9 fL (ref 7.5–12.5)
Monocytes Relative: 8.5 %
Neutro Abs: 6412 cells/uL (ref 1500–7800)
Neutrophils Relative %: 69.7 %
Platelets: 275 10*3/uL (ref 140–400)
RBC: 5.64 10*6/uL (ref 4.20–5.80)
RDW: 13.3 % (ref 11.0–15.0)
Total Lymphocyte: 17.9 %
WBC: 9.2 10*3/uL (ref 3.8–10.8)

## 2021-12-08 LAB — MICROALBUMIN / CREATININE URINE RATIO
Creatinine, Urine: 57 mg/dL (ref 20–320)
Microalb Creat Ratio: 56 mcg/mg creat — ABNORMAL HIGH (ref <30)
Microalb, Ur: 3.2 mg/dL

## 2021-12-08 LAB — ALDOSTERONE + RENIN ACTIVITY W/ RATIO
ALDO / PRA Ratio: 6.7 Ratio (ref 0.9–28.9)
Aldosterone: 3 ng/dL
Renin Activity: 0.45 ng/mL/h (ref 0.25–5.82)

## 2021-12-18 ENCOUNTER — Telehealth: Payer: Self-pay | Admitting: Nurse Practitioner

## 2021-12-18 NOTE — Telephone Encounter (Unsigned)
Copied from [place] [id number]. Topic: General - Other >> Dec 18, 2021 12:22 PM Leitha Schuller wrote: Reason for CRM: caller inquiring if disability forms from [place] has been received   Please assist further

## 2021-12-19 ENCOUNTER — Other Ambulatory Visit: Payer: Self-pay | Admitting: Emergency Medicine

## 2021-12-19 MED ORDER — NEBIVOLOL HCL 5 MG PO TABS: 10.0000 mg | ORAL_TABLET | Freq: Every day | ORAL | 0 refills | Status: DC

## 2021-12-19 NOTE — Telephone Encounter (Signed)
Patient stated BP this morning was 140/90 after lunch it was 120/79. Need refill on Bystolic

## 2021-12-19 NOTE — Telephone Encounter (Signed)
Left message for patient to call office back or send new disability paperwork by Mychart. Last paperwork received was on 12/03/21 from [place]

## 2021-12-20 ENCOUNTER — Other Ambulatory Visit: Payer: Self-pay | Admitting: Nurse Practitioner

## 2021-12-21 NOTE — Telephone Encounter (Signed)
Requested medications are due for refill today.  Unsure  Requested medications are on the active medications list.  no  Last refill. none  Future visit scheduled.   yes  Notes to clinic.  No rx for valsartan alone. Valsartan is in a combination medication on med list in different dosage.    Requested Prescriptions  Pending Prescriptions Disp Refills   valsartan (DIOVAN) 80 MG tablet [Pharmacy Med Name: VALSARTAN 80 MG TABLET] 90 tablet     Sig: TAKE 1 TABLET BY MOUTH DAILY     Cardiovascular:  Angiotensin Receptor Blockers Failed - 12/20/2021  6:22 AM      Failed - Last BP in normal range    BP Readings from Last 1 Encounters:  12/05/21 (!) 162/90         Passed - Cr in normal range and within 180 days    Creat  Date Value Ref Range Status  12/03/2021 1.23 0.70 - 1.35 mg/dL Final   Creatinine, Urine  Date Value Ref Range Status  12/03/2021 57 20 - 320 mg/dL Final         Passed - K in normal range and within 180 days    Potassium  Date Value Ref Range Status  12/03/2021 4.6 3.5 - 5.3 mmol/L Final         Passed - Patient is not pregnant      Passed - Valid encounter within last 6 months    Recent Outpatient Visits           2 weeks ago Hypertension, unspecified type   [place], FNP   3 weeks ago Hypertension, unspecified type   [facility] Serafina Royals F, FNP   3 months ago History of TIA (transient ischemic attack)   [facility] Bo Merino, FNP   2 years ago Pure hypercholesterolemia   [place], [place], FNP   6 years ago Hyperlipidemia   [facility] Roselee Nova, MD       Future Appointments             In 1 week Reece Packer, Myna Hidalgo, [facility], [place]   In 1 month Kate Sable, MD [place]. [place]   In 1 month Pender, Myna Hidalgo, [facility], [facility]

## 2021-12-26 ENCOUNTER — Other Ambulatory Visit: Payer: Self-pay

## 2021-12-26 DIAGNOSIS — I10 Essential (primary) hypertension: Secondary | ICD-10-CM

## 2021-12-26 MED ORDER — VALSARTAN-HYDROCHLOROTHIAZIDE 160-12.5 MG PO TABS: 1.0000 | ORAL_TABLET | Freq: Every day | ORAL | 0 refills | Status: DC

## 2021-12-28 ENCOUNTER — Other Ambulatory Visit: Payer: Self-pay | Admitting: Nurse Practitioner

## 2021-12-28 DIAGNOSIS — F419 Anxiety disorder, unspecified: Secondary | ICD-10-CM

## 2021-12-28 NOTE — Telephone Encounter (Signed)
Has appt 12/31/21 to assess dose. Requested Prescriptions  Pending Prescriptions Disp Refills  . escitalopram (LEXAPRO) 10 MG tablet [Pharmacy Med Name: ESCITALOPRAM 10 MG TABLET] 30 tablet 0    Sig: TAKE 1 TABLET BY MOUTH DAILY     Psychiatry:  Antidepressants - SSRI Passed - 12/28/2021 11:44 AM      Passed - Valid encounter within last 6 months    Recent Outpatient Visits          3 weeks ago Hypertension, unspecified type   [place], FNP   4 weeks ago Hypertension, unspecified type   [facility] Serafina Royals F, FNP   4 months ago History of TIA (transient ischemic attack)   [facility] Bo Merino, FNP   2 years ago Pure hypercholesterolemia   [place], [place], FNP   6 years ago Hyperlipidemia   [place], MD      Future Appointments            In 3 days Reece Packer, Myna Hidalgo, [facility], [place]   In 4 weeks Kate Sable, MD [place]. [place]   In 1 month Pender, Myna Hidalgo, [facility], [facility]

## 2021-12-29 ENCOUNTER — Other Ambulatory Visit: Payer: Self-pay | Admitting: Nurse Practitioner

## 2021-12-31 ENCOUNTER — Other Ambulatory Visit: Payer: Self-pay

## 2021-12-31 ENCOUNTER — Ambulatory Visit (INDEPENDENT_AMBULATORY_CARE_PROVIDER_SITE_OTHER): Payer: 59 | Admitting: Nurse Practitioner

## 2021-12-31 ENCOUNTER — Encounter: Payer: Self-pay | Admitting: Nurse Practitioner

## 2021-12-31 VITALS — BP 142/84 | HR 56 | Temp 97.8°F | Resp 18 | Ht 69.25 in | Wt 223.9 lb

## 2021-12-31 DIAGNOSIS — F419 Anxiety disorder, unspecified: Secondary | ICD-10-CM | POA: Diagnosis not present

## 2021-12-31 DIAGNOSIS — I10 Essential (primary) hypertension: Secondary | ICD-10-CM | POA: Diagnosis not present

## 2021-12-31 MED ORDER — ESCITALOPRAM OXALATE 10 MG PO TABS: 10.0000 mg | ORAL_TABLET | Freq: Every day | ORAL | 0 refills | Status: DC

## 2021-12-31 NOTE — Assessment & Plan Note (Signed)
Continue taking valsartan-hydrochlorothiazide 160-12.5 mg daily, Cardizem 240 mg daily and Bystolic 10 mg daily.  Has an appointment with hypertension clinic on 01/25/2022.

## 2021-12-31 NOTE — Progress Notes (Signed)
BP (!) 142/84   Pulse (!) 56   Temp 97.8 F (36.6 C) (Oral)   Resp 18   Ht 5' 9.25" (1.759 m)   Wt 223 lb 14.4 oz (101.6 kg)   SpO2 96%   BMI 32.83 kg/m    Subjective:    Patient ID: Clarene Friedman, male    DOB: 12-09-1960, 61 y.o.   MRN: [id number]  HPI: Douglas Friedman is a 61 y.o. male  Chief Complaint  Patient presents with   Hypertension    4 week recheck   Hypertension: His blood pressure today is 148/86.  He reports that his blood pressure has been running after a walk in the afternoon is when he gets the best blood pressure which is 119-120s at home. He is currently taking valsartan-hydrochlorothiazide 160-12.5 mg daily, Cardizem 240 mg daily, and Bystolic 10 mg daily. He reports that he is feeling good.  He has an appointment with cardiologist Dr. Clayborn Bigness  in 2 days.  Had also placed a referral for the hypertension clinic.  Patient is going to touch base with Dr. Clayborn Bigness and see if he still needs to go to that.  Anxiety: Last visit we started patient on Lexapro 10 mg daily.  He had a lot of increased anxiety due to his health and stress with disability and FMLA.  Patient reports that his anxiety has been much better. Will continue with current dose..      12/31/2021   10:30 AM 12/03/2021    3:37 PM 08/29/2021    2:46 PM  GAD 7 : Generalized Anxiety Score  Nervous, Anxious, on Edge 0 3 3  Control/stop worrying 0 3 3  Worry too much - different things 0 3 3  Trouble relaxing 0 2 3  Restless 0 2 0  Easily annoyed or irritable 0 2 3  Afraid - awful might happen 0 2 0  Total GAD 7 Score 0 17 15  Anxiety Difficulty Not difficult at all  Not difficult at all          12/31/2021   10:29 AM 12/31/2021   10:10 AM 12/03/2021    3:37 PM 12/03/2021    3:16 PM 11/29/2021    8:03 AM  Depression screen PHQ 2/9  Decreased Interest 0 0 0 0 0  Down, Depressed, Hopeless 0 0 0 0 0  PHQ - 2 Score 0 0 0 0 0  Altered sleeping 0  0 0   Tired, decreased energy 1  1 0   Change in  appetite 0  0 0   Feeling bad or failure about yourself  0  1 0   Trouble concentrating 0  0 0   Moving slowly or fidgety/restless 0  2 0   Suicidal thoughts 0  0 0   PHQ-9 Score 1  4 0   Difficult doing work/chores Not difficult at all  Not difficult at all Not difficult at all      Relevant past medical, surgical, family and social history reviewed and updated as indicated. Interim medical history since our last visit reviewed. Allergies and medications reviewed and updated.  Review of Systems  Constitutional: Negative for fever or weight change.  Respiratory: Negative for cough and shortness of breath.   Cardiovascular: Negative for chest pain or palpitations.  Gastrointestinal: Negative for abdominal pain, no bowel changes.  Musculoskeletal: Negative for gait problem or joint swelling.  Skin: Negative for rash.  Neurological: Negative for dizziness, positive for headache.  No other specific complaints in a complete review of systems (except as listed in HPI above).      Objective:    BP (!) 142/84   Pulse (!) 56   Temp 97.8 F (36.6 C) (Oral)   Resp 18   Ht 5' 9.25" (1.759 m)   Wt 223 lb 14.4 oz (101.6 kg)   SpO2 96%   BMI 32.83 kg/m   Wt Readings from Last 3 Encounters:  12/31/21 223 lb 14.4 oz (101.6 kg)  12/03/21 232 lb 4.8 oz (105.4 kg)  11/29/21 230 lb (104.3 kg)    Physical Exam Constitutional: Patient appears well-developed and well-nourished. Obese  No distress.  HEENT: head atraumatic, normocephalic, pupils equal and reactive to light, neck supple Cardiovascular: Normal rate, regular rhythm and normal heart sounds.  No murmur heard. No BLE edema. Pulmonary/Chest: Effort normal and breath sounds normal. No respiratory distress. Abdominal: Soft.  There is no tenderness. Psychiatric: Patient has a normal mood and affect. behavior is normal. Judgment and thought content normal.   Results for orders placed or performed in visit on 12/05/21  Calcium, ionized   Result Value Ref Range   Calcium, Ion 5.5 4.7 - 5.5 mg/dL  Parathyroid hormone, intact (no Ca)  Result Value Ref Range   PTH 57 16 - 77 pg/mL      Assessment & Plan:   Problem List Items Addressed This Visit       Cardiovascular and Mediastinum   Hypertension - Primary    Continue taking valsartan-hydrochlorothiazide 160-12.5 mg daily, Cardizem 240 mg daily and Bystolic 10 mg daily.  Has an appointment with hypertension clinic on 01/25/2022.        Other   Anxiety   Relevant Medications   escitalopram (LEXAPRO) 10 MG tablet     Follow up plan: Return in about 3 months (around 04/01/2022) for follow up.

## 2021-12-31 NOTE — Telephone Encounter (Signed)
Requested Prescriptions  Pending Prescriptions Disp Refills  . nebivolol (BYSTOLIC) 5 MG tablet [Pharmacy Med Name: NEBIVOLOL 5 MG TABLET] 30 tablet     Sig: TAKE 1 TABLET BY MOUTH DAILY     Cardiovascular: Beta Blockers 3 Failed - 12/29/2021  6:53 AM      Failed - Last BP in normal range    BP Readings from Last 1 Encounters:  12/05/21 (!) 162/90         Passed - Cr in normal range and within 360 days    Creat  Date Value Ref Range Status  12/03/2021 1.23 0.70 - 1.35 mg/dL Final   Creatinine, Urine  Date Value Ref Range Status  12/03/2021 57 20 - 320 mg/dL Final         Passed - AST in normal range and within 360 days    AST  Date Value Ref Range Status  12/03/2021 20 10 - 35 U/L Final         Passed - ALT in normal range and within 360 days    ALT  Date Value Ref Range Status  12/03/2021 23 9 - 46 U/L Final         Passed - Last Heart Rate in normal range    Pulse Readings from Last 1 Encounters:  12/03/21 70         Passed - Valid encounter within last 6 months    Recent Outpatient Visits          4 weeks ago Hypertension, unspecified type   [place], FNP   1 month ago Hypertension, unspecified type   [facility] Serafina Royals F, FNP   4 months ago History of TIA (transient ischemic attack)   [facility] Bo Merino, FNP   2 years ago Pure hypercholesterolemia   [place], Astrid Divine, FNP   6 years ago Hyperlipidemia   [place], Talbert [place], MD      Future Appointments            Today Reece Packer, Myna Hidalgo, [facility], [place]   In 3 weeks Kate Sable, MD [place]. [place]   In 1 month Pender, Myna Hidalgo, [facility], [facility]

## 2022-01-09 ENCOUNTER — Ambulatory Visit: Payer: 59 | Attending: Nurse Practitioner

## 2022-01-09 DIAGNOSIS — I10 Essential (primary) hypertension: Secondary | ICD-10-CM

## 2022-01-18 ENCOUNTER — Telehealth: Payer: Self-pay | Admitting: Cardiology

## 2022-01-18 NOTE — Telephone Encounter (Signed)
-----   Message from Horton Finer sent at 01/16/2022 12:08 PM EDT ----- Regarding: new patient appt Could you please verify this patient needs appt with Dr. Garen Lah on 01/25/22. He is established with Dr. Clayborn Bigness [facility]) and just saw him on on 01/02/22. Thank you!!

## 2022-01-18 NOTE — Telephone Encounter (Signed)
LMOV to clarify if appointment still needed

## 2022-01-25 ENCOUNTER — Ambulatory Visit: Payer: 59 | Admitting: Cardiology

## 2022-01-28 ENCOUNTER — Telehealth: Payer: Self-pay | Admitting: Nurse Practitioner

## 2022-01-28 ENCOUNTER — Encounter: Payer: Self-pay | Admitting: Nurse Practitioner

## 2022-01-28 NOTE — Telephone Encounter (Signed)
Copied from [place] [id number]. Topic: General - Other >> Jan 28, 2022  9:46 AM Everette C wrote: Reason for CRM: The patient has called to request a letter of clearance allowing them to return to work by 02/05/22  Please contact the patient further when possible

## 2022-01-28 NOTE — Telephone Encounter (Signed)
Left message for patient to call office back.

## 2022-01-28 NOTE — Telephone Encounter (Signed)
Letter at front desk

## 2022-02-06 ENCOUNTER — Ambulatory Visit: Payer: 59 | Admitting: Nurse Practitioner

## 2022-02-23 ENCOUNTER — Other Ambulatory Visit: Payer: Self-pay | Admitting: Nurse Practitioner

## 2022-02-23 DIAGNOSIS — I10 Essential (primary) hypertension: Secondary | ICD-10-CM

## 2022-02-25 NOTE — Telephone Encounter (Signed)
Future visit in 4 weeks.  Requested Prescriptions  Pending Prescriptions Disp Refills   valsartan-hydrochlorothiazide (DIOVAN-HCT) 160-12.5 MG tablet [Pharmacy Med Name: VALSARTAN-HCTZ 160-12.5 MG TAB] 90 tablet 0    Sig: TAKE 1 TABLET BY MOUTH DAILY     Cardiovascular: ARB + Diuretic Combos Failed - 02/23/2022  6:52 AM      Failed - Last BP in normal range    BP Readings from Last 1 Encounters:  12/31/21 (!) 142/84         Passed - K in normal range and within 180 days    Potassium  Date Value Ref Range Status  12/03/2021 4.6 3.5 - 5.3 mmol/L Final         Passed - Na in normal range and within 180 days    Sodium  Date Value Ref Range Status  12/03/2021 142 135 - 146 mmol/L Final  06/20/2015 141 134 - 144 mmol/L Final         Passed - Cr in normal range and within 180 days    Creat  Date Value Ref Range Status  12/03/2021 1.23 0.70 - 1.35 mg/dL Final   Creatinine, Urine  Date Value Ref Range Status  12/03/2021 57 20 - 320 mg/dL Final         Passed - eGFR is 10 or above and within 180 days    GFR calc Af Amer  Date Value Ref Range Status  08/18/2018 >60 >60 mL/min Final   GFR calc non Af Amer  Date Value Ref Range Status  08/18/2018 >60 >60 mL/min Final   eGFR  Date Value Ref Range Status  12/03/2021 67 > OR = 60 mL/min/1.89m Final         Passed - Patient is not pregnant      Passed - Valid encounter within last 6 months    Recent Outpatient Visits           1 month ago Hypertension, unspecified type   [place]   2 months ago [facility]pertension, unspecified type   [facility]   2 months ago [facility]pertension, unspecified type   [facility]   6 months ago History of TIA (transient ischemic attack)   [facility]   2 years ago Pure hypercholesterolemia   [place]      Future Appointments             In 4 weeks PReece Packer [facility]

## 2022-03-18 ENCOUNTER — Other Ambulatory Visit: Payer: Self-pay | Admitting: Nurse Practitioner

## 2022-03-19 NOTE — Telephone Encounter (Signed)
Requested Prescriptions  Pending Prescriptions Disp Refills   nebivolol (BYSTOLIC) 5 MG tablet [Pharmacy Med Name: NEBIVOLOL 5 MG TABLET] 180 tablet 0    Sig: TAKE 2 TABLETS BY MOUTH DAILY     Cardiovascular: Beta Blockers 3 Failed - 03/18/2022 10:06 AM      Failed - Last BP in normal range    BP Readings from Last 1 Encounters:  12/31/21 (!) 142/84         Passed - Cr in normal range and within 360 days    Creat  Date Value Ref Range Status  12/03/2021 1.23 0.70 - 1.35 mg/dL Final   Creatinine, Urine  Date Value Ref Range Status  12/03/2021 57 20 - 320 mg/dL Final         Passed - AST in normal range and within 360 days    AST  Date Value Ref Range Status  12/03/2021 20 10 - 35 U/L Final         Passed - ALT in normal range and within 360 days    ALT  Date Value Ref Range Status  12/03/2021 23 9 - 46 U/L Final         Passed - Last Heart Rate in normal range    Pulse Readings from Last 1 Encounters:  12/31/21 (!) 56         Passed - Valid encounter within last 6 months    Recent Outpatient Visits           2 months ago Hypertension, unspecified type   [place], FNP   3 months ago Hypertension, unspecified type   [place], FNP   3 months ago Hypertension, unspecified type   [facility] Serafina Royals F, FNP   6 months ago History of TIA (transient ischemic attack)   [facility] Bo Merino, FNP   2 years ago Pure hypercholesterolemia   [place], Astrid Divine, [place]       Future Appointments             In 6 days Reece Packer, Myna Hidalgo, [facility], [facility]

## 2022-03-25 ENCOUNTER — Other Ambulatory Visit: Payer: Self-pay

## 2022-03-25 ENCOUNTER — Encounter: Payer: Self-pay | Admitting: Nurse Practitioner

## 2022-03-25 ENCOUNTER — Ambulatory Visit (INDEPENDENT_AMBULATORY_CARE_PROVIDER_SITE_OTHER): Payer: 59 | Admitting: Nurse Practitioner

## 2022-03-25 VITALS — BP 130/72 | HR 70 | Temp 99.0°F | Resp 16 | Ht 69.25 in | Wt 226.4 lb

## 2022-03-25 DIAGNOSIS — Z8673 Personal history of transient ischemic attack (TIA), and cerebral infarction without residual deficits: Secondary | ICD-10-CM | POA: Diagnosis not present

## 2022-03-25 DIAGNOSIS — I10 Essential (primary) hypertension: Secondary | ICD-10-CM | POA: Diagnosis not present

## 2022-03-25 DIAGNOSIS — E669 Obesity, unspecified: Secondary | ICD-10-CM | POA: Diagnosis not present

## 2022-03-25 DIAGNOSIS — F419 Anxiety disorder, unspecified: Secondary | ICD-10-CM

## 2022-03-25 DIAGNOSIS — C4441 Basal cell carcinoma of skin of scalp and neck: Secondary | ICD-10-CM

## 2022-03-25 DIAGNOSIS — E782 Mixed hyperlipidemia: Secondary | ICD-10-CM

## 2022-03-25 MED ORDER — ESCITALOPRAM OXALATE 10 MG PO TABS: 10.0000 mg | ORAL_TABLET | Freq: Every day | ORAL | 1 refills | Status: DC

## 2022-03-25 NOTE — Progress Notes (Signed)
BP 130/72   Pulse 70   Temp 99 F (37.2 C) (Oral)   Resp 16   Ht 5' 9.25" (1.759 m)   Wt 226 lb 6.4 oz (102.7 kg)   SpO2 95%   BMI 33.19 kg/m    Subjective:    Patient ID: Douglas Friedman, male    DOB: April 03, 1961, 61 y.o.   MRN: [id number]  HPI: Douglas Friedman is a 61 y.o. male  Chief Complaint  Patient presents with   Hypertension   Hyperlipidemia   Hypertension: His blood pressure today is 130/72. He says his blood pressure has been running 120-130s/70-80s.  He is currently on cardizem 841 mg daily, bystolic 10 mg daily, and valsartan-hydrochlorothiazide 160-12.5 mg daily.  He reports he is doing well.  He denies any chest pain, shortness of breath, headaches or blurred vision.  Last saw [facility] cardiology on 01/02/2022.   History of TIA/hyperlipidemia:  patient reports he is doing well.  He last saw neurology on 01/07/2022. His last LDL was 80 on 08/20/2021.Continue taking asa 81 mg daily and atorvastatin 40 mg daily.    basal cell carcinoma:last saw dermatology on 02/14/2022. Recently had area removed and has follow up in February, he reports he is doing well.   Obesity current weight is 226 lbs with a BMI of 33.19.  Patient is working on lifestyle modification.  Anxiety:Patient has been taking lexapro 10 mg daily.  Reports that this has been helpful for his mood.  Will continue with current treatment.       03/25/2022    9:17 AM 12/31/2021   10:30 AM 12/03/2021    3:37 PM 08/29/2021    2:46 PM  GAD 7 : Generalized Anxiety Score  Nervous, Anxious, on Edge 0 0 3 3  Control/stop worrying 0 0 3 3  Worry too much - different things 0 0 3 3  Trouble relaxing 0 0 2 3  Restless 0 0 2 0  Easily annoyed or irritable 0 0 2 3  Afraid - awful might happen 0 0 2 0  Total GAD 7 Score 0 0 17 15  Anxiety Difficulty Not difficult at all Not difficult at all  Not difficult at all          03/25/2022    9:16 AM 12/31/2021   10:29 AM 12/31/2021   10:10 AM 12/03/2021    3:37 PM  12/03/2021    3:16 PM  Depression screen PHQ 2/9  Decreased Interest 0 0 0 0 0  Down, Depressed, Hopeless 0 0 0 0 0  PHQ - 2 Score 0 0 0 0 0  Altered sleeping 0 0  0 0  Tired, decreased energy 0 1  1 0  Change in appetite 0 0  0 0  Feeling bad or failure about yourself  0 0  1 0  Trouble concentrating 0 0  0 0  Moving slowly or fidgety/restless 0 0  2 0  Suicidal thoughts 0 0  0 0  PHQ-9 Score 0 1  4 0  Difficult doing work/chores Not difficult at all Not difficult at all  Not difficult at all Not difficult at all     Relevant past medical, surgical, family and social history reviewed and updated as indicated. Interim medical history since our last visit reviewed. Allergies and medications reviewed and updated.  Review of Systems  Constitutional: Negative for fever or weight change.  Respiratory: Negative for cough and shortness of breath.  Cardiovascular: Negative for chest pain or palpitations.  Gastrointestinal: Negative for abdominal pain, no bowel changes.  Musculoskeletal: Negative for gait problem or joint swelling.  Skin: Negative for rash.  Neurological: Negative for dizziness, positive for headache.  No other specific complaints in a complete review of systems (except as listed in HPI above).      Objective:    BP 130/72   Pulse 70   Temp 99 F (37.2 C) (Oral)   Resp 16   Ht 5' 9.25" (1.759 m)   Wt 226 lb 6.4 oz (102.7 kg)   SpO2 95%   BMI 33.19 kg/m   Wt Readings from Last 3 Encounters:  03/25/22 226 lb 6.4 oz (102.7 kg)  12/31/21 223 lb 14.4 oz (101.6 kg)  12/03/21 232 lb 4.8 oz (105.4 kg)    Physical Exam Constitutional: Patient appears well-developed and well-nourished. Obese  No distress.  HEENT: head atraumatic, normocephalic, pupils equal and reactive to light, neck supple Cardiovascular: Normal rate, regular rhythm and normal heart sounds.  No murmur heard. No BLE edema. Pulmonary/Chest: Effort normal and breath sounds normal. No respiratory  distress. Abdominal: Soft.  There is no tenderness. Psychiatric: Patient has a normal mood and affect. behavior is normal. Judgment and thought content normal.   Results for orders placed or performed in visit on 12/05/21  Calcium, ionized  Result Value Ref Range   Calcium, Ion 5.5 4.7 - 5.5 mg/dL  Parathyroid hormone, intact (no Ca)  Result Value Ref Range   PTH 57 16 - 77 pg/mL      Assessment & Plan:   Problem List Items Addressed This Visit       Cardiovascular and Mediastinum   Hypertension - Primary    Continue taking cardizem 226 mg daily, bystolic 10 mg daily and valsartan-hydrochlorothiazide 160-12.5 mg daily.          Musculoskeletal and Integument   Basal cell carcinoma (BCC) of scalp    Recently had a spot removed. Patient reports he is doing well. Has f/u with derm in February        Other   Hyperlipidemia    Continue taking atorvastatin 40 mg daily.       History of TIA (transient ischemic attack)    Doing well, no new symptoms. Continue taking atorvastatin 40 mg daily and asa 81 mg daily.       Obesity (BMI 30-39.9)    Continue working on lifestyle  modification      Anxiety    Anxiety has improved, continue lexapro 10 mg daily.       Relevant Medications   escitalopram (LEXAPRO) 10 MG tablet     Follow up plan: Return in about 4 months (around 07/25/2022) for follow up.

## 2022-03-25 NOTE — Assessment & Plan Note (Signed)
Continue taking atorvastatin 40 mg daily. 

## 2022-03-25 NOTE — Assessment & Plan Note (Signed)
Recently had a spot removed. Patient reports he is doing well. Has f/u with derm in February

## 2022-03-25 NOTE — Assessment & Plan Note (Signed)
Continue working on lifestyle modification 

## 2022-03-25 NOTE — Assessment & Plan Note (Signed)
Anxiety has improved, continue lexapro 10 mg daily.

## 2022-03-25 NOTE — Assessment & Plan Note (Signed)
Continue taking cardizem 688 mg daily, bystolic 10 mg daily and valsartan-hydrochlorothiazide 160-12.5 mg daily.

## 2022-03-25 NOTE — Assessment & Plan Note (Signed)
Doing well, no new symptoms. Continue taking atorvastatin 40 mg daily and asa 81 mg daily.

## 2022-05-08 ENCOUNTER — Ambulatory Visit: Payer: Self-pay

## 2022-05-08 ENCOUNTER — Ambulatory Visit (INDEPENDENT_AMBULATORY_CARE_PROVIDER_SITE_OTHER): Payer: 59 | Admitting: Nurse Practitioner

## 2022-05-08 ENCOUNTER — Other Ambulatory Visit: Payer: Self-pay

## 2022-05-08 ENCOUNTER — Encounter: Payer: Self-pay | Admitting: Nurse Practitioner

## 2022-05-08 VITALS — BP 118/76 | HR 56 | Temp 98.4°F | Resp 16 | Ht 69.25 in | Wt 226.7 lb

## 2022-05-08 DIAGNOSIS — J3489 Other specified disorders of nose and nasal sinuses: Secondary | ICD-10-CM

## 2022-05-08 DIAGNOSIS — H6990 Unspecified Eustachian tube disorder, unspecified ear: Secondary | ICD-10-CM

## 2022-05-08 DIAGNOSIS — R42 Dizziness and giddiness: Secondary | ICD-10-CM

## 2022-05-08 DIAGNOSIS — R29898 Other symptoms and signs involving the musculoskeletal system: Secondary | ICD-10-CM

## 2022-05-08 MED ORDER — MECLIZINE HCL 25 MG PO TABS: 25.0000 mg | ORAL_TABLET | Freq: Three times a day (TID) | ORAL | 0 refills | Status: AC | PRN

## 2022-05-08 MED ORDER — PREDNISONE 10 MG (21) PO TBPK: ORAL_TABLET | ORAL | 0 refills | Status: DC

## 2022-05-08 NOTE — Telephone Encounter (Signed)
  Chief Complaint: dizziness Symptoms: dizziness and motion sickness to where had fall this morning, no injuries, had HA several days as well  Frequency: Monday  Pertinent Negatives: Patient denies HA today  Disposition: '[]'$ ED /'[]'$ Urgent Care (no appt availability in office) / '[x]'$ Appointment(In office/virtual)/ '[]'$  [place] [facility]/ '[]'$ Home Care/ '[]'$ Refused Recommended Disposition /'[]'$ [place] [facility]/ '[]'$  Follow-up with PCP Additional Notes: wife calling concerned about pt dizziness and fall this morning, has been on BP meds for a long time now but hasn't checked BP recently. Scheduled OV at 1140 with Almyra Free, NP.   Reason for Disposition  [1] MODERATE dizziness (e.g., interferes with normal activities) AND [2] has NOT been evaluated by doctor (or NP/PA) for this  (Exception: Dizziness caused by heat exposure, sudden standing, or poor fluid intake.)  Answer Assessment - Initial Assessment Questions 1. DESCRIPTION: "Describe your dizziness."     Dizziness and motion sickness 2. LIGHTHEADED: "Do you feel lightheaded?" (e.g., somewhat faint, woozy, weak upon standing)     Woozy  4. SEVERITY: "How bad is it?"  "Do you feel like you are going to faint?" "Can you stand and walk?"   - MILD: Feels slightly dizzy, but walking normally.   - MODERATE: Feels unsteady when walking, but not falling; interferes with normal activities (e.g., school, work).   - SEVERE: Unable to walk without falling, or requires assistance to walk without falling; feels like passing out now.      Moderate  5. ONSET:  "When did the dizziness begin?"     Monday 6. AGGRAVATING FACTORS: "Does anything make it worse?" (e.g., standing, change in head position)     Standing or bending over  10. OTHER SYMPTOMS: "Do you have any other symptoms?" (e.g., fever, chest pain, vomiting, diarrhea, bleeding)       Had HA for several days and had fall this morning where he fell over on couch when bending over  Protocols used:  Dizziness - Lightheadedness-A-AH

## 2022-05-08 NOTE — Progress Notes (Signed)
BP 118/76   Pulse (!) 56   Temp 98.4 F (36.9 C) (Oral)   Resp 16   Ht 5' 9.25" (1.759 m)   Wt 226 lb 11.2 oz (102.8 kg)   SpO2 98%   BMI 33.24 kg/m    Subjective:    Patient ID: Douglas Friedman, male    DOB: 1961/03/10, 62 y.o.   MRN: [id number]  HPI: Douglas Friedman is a 62 y.o. male  Chief Complaint  Patient presents with   Dizziness   Headache   Headache/dizziness: Reports a couple days ago he started having a dull headache.  States that headache has been relieved.  States the last few days he has had dizziness.  Patient states he has noticed it with position changes.  Especially when leaning forward.  Patient states he does feel a lot of pressure in his sinuses.  She states he did use Alka-Seltzer yesterday and that helped him feel better.  Denies any recent illness.  Any chest pain, shortness of breath, fever, numbness, tingling, changes in speech, changes in mentation, changes in gait.  Neuroexam completed and was normal.  Equal grips, no arm drift , pupils PERRL,, stable gait.  Negative Romberg test.  Discussed with patient likely dizziness is associated with Dysfunction of Eustachian tube. Will treat with meclizine and steroid taper.  Discussed with patient if no improvement to reach out to Dr. Manuella Ghazi neurology and if dizziness worsens to go to er. Patient verbalized understanding.   : Patient reports that for years his right knee has been popping.  States it is not giving him any pain.  Patient denies any trauma.  patient would like to get an x-ray to see if he might need surgery.  X-ray ordered.  Relevant past medical, surgical, family and social history reviewed and updated as indicated. Interim medical history since our last visit reviewed. Allergies and medications reviewed and updated.  Review of Systems  Constitutional: Negative for fever or weight change.  Respiratory: Negative for cough and shortness of breath.   Cardiovascular: Negative for chest pain or  palpitations.  Gastrointestinal: Negative for abdominal pain, no bowel changes.  Musculoskeletal: Negative for gait problem or joint swelling.  Skin: Negative for rash.  Neurological: Positive for dizziness or headache.  No other specific complaints in a complete review of systems (except as listed in HPI above).      Objective:    BP 118/76   Pulse (!) 56   Temp 98.4 F (36.9 C) (Oral)   Resp 16   Ht 5' 9.25" (1.759 m)   Wt 226 lb 11.2 oz (102.8 kg)   SpO2 98%   BMI 33.24 kg/m   Wt Readings from Last 3 Encounters:  05/08/22 226 lb 11.2 oz (102.8 kg)  03/25/22 226 lb 6.4 oz (102.7 kg)  12/31/21 223 lb 14.4 oz (101.6 kg)    Physical Exam  Constitutional: Patient appears well-developed and well-nourished. Obese  No distress.  HEENT: head atraumatic, normocephalic, pupils equal and reactive to light, ears TMs clear, neck supple, throat within normal limits Cardiovascular: Normal rate, regular rhythm and normal heart sounds.  No murmur heard. No BLE edema. Pulmonary/Chest: Effort normal and breath sounds normal. No respiratory distress. Abdominal: Soft.  There is no tenderness. Psychiatric: Patient has a normal mood and affect. behavior is normal. Judgment and thought content normal.   Results for orders placed or performed in visit on 12/05/21  Calcium, ionized  Result Value Ref Range   Calcium,  Ion 5.5 4.7 - 5.5 mg/dL  Parathyroid hormone, intact (no Ca)  Result Value Ref Range   PTH 57 16 - 77 pg/mL      Assessment & Plan:   Problem List Items Addressed This Visit   None Visit Diagnoses     Vertigo    -  Primary   Relevant Medications   meclizine (ANTIVERT) 25 MG tablet   Sinus pressure       Relevant Medications   predniSONE (STERAPRED UNI-PAK 21 TAB) 10 MG (21) TBPK tablet   Popping of right knee joint       Relevant Orders   DG Knee Complete 4 Views Right   Dysfunction of Eustachian tube, unspecified laterality            Follow up plan: Return if  symptoms worsen or fail to improve.

## 2022-05-09 ENCOUNTER — Encounter: Payer: Self-pay | Admitting: Nurse Practitioner

## 2022-05-09 ENCOUNTER — Telehealth: Payer: Self-pay | Admitting: Nurse Practitioner

## 2022-05-09 NOTE — Telephone Encounter (Signed)
Douglas Friedman spoke to patient

## 2022-05-09 NOTE — Telephone Encounter (Signed)
Copied from [place] [id number]. Topic: General - Other >> May 09, 2022 10:07 AM [place] B wrote: Reason for CRM: Patient called in asking to speak with Bonnita Nasuti about how his appt went yesterday at [facility]

## 2022-06-15 ENCOUNTER — Other Ambulatory Visit: Payer: Self-pay | Admitting: Nurse Practitioner

## 2022-06-17 NOTE — Telephone Encounter (Signed)
Requested Prescriptions  Pending Prescriptions Disp Refills   nebivolol (BYSTOLIC) 5 MG tablet [Pharmacy Med Name: NEBIVOLOL 5 MG TABLET] 180 tablet 0    Sig: TAKE 2 TABLETS BY MOUTH DAILY     Cardiovascular: Beta Blockers 3 Passed - 06/15/2022  6:53 AM      Passed - Cr in normal range and within 360 days    Creat  Date Value Ref Range Status  12/03/2021 1.23 0.70 - 1.35 mg/dL Final   Creatinine, Urine  Date Value Ref Range Status  12/03/2021 57 20 - 320 mg/dL Final         Passed - AST in normal range and within 360 days    AST  Date Value Ref Range Status  12/03/2021 20 10 - 35 U/L Final         Passed - ALT in normal range and within 360 days    ALT  Date Value Ref Range Status  12/03/2021 23 9 - 46 U/L Final         Passed - Last BP in normal range    BP Readings from Last 1 Encounters:  05/08/22 118/76         Passed - Last Heart Rate in normal range    Pulse Readings from Last 1 Encounters:  05/08/22 (!) 56         Passed - Valid encounter within last 6 months    Recent Outpatient Visits           1 month ago [facility] Bo Merino, FNP   2 months ago Hypertension, unspecified type   [facility] Bo Merino, FNP   5 months ago Hypertension, unspecified type   [facility] Bo Merino, FNP   6 months ago Hypertension, unspecified type   [place], FNP   6 months ago Hypertension, unspecified type   [facility] Bo Merino, FNP       Future Appointments             In 1 month Reece Packer, Myna Hidalgo, [facility], [facility]

## 2022-06-20 ENCOUNTER — Other Ambulatory Visit: Payer: Self-pay | Admitting: Nurse Practitioner

## 2022-06-20 DIAGNOSIS — I10 Essential (primary) hypertension: Secondary | ICD-10-CM

## 2022-06-20 NOTE — Telephone Encounter (Signed)
Requested Prescriptions  Pending Prescriptions Disp Refills   valsartan-hydrochlorothiazide (DIOVAN-HCT) 160-12.5 MG tablet [Pharmacy Med Name: VALSARTAN-HCTZ 160-12.5 MG TAB] 90 tablet 0    Sig: TAKE 1 TABLET BY MOUTH DAILY     Cardiovascular: ARB + Diuretic Combos Failed - 06/20/2022  6:22 AM      Failed - K in normal range and within 180 days    Potassium  Date Value Ref Range Status  12/03/2021 4.6 3.5 - 5.3 mmol/L Final         Failed - Na in normal range and within 180 days    Sodium  Date Value Ref Range Status  12/03/2021 142 135 - 146 mmol/L Final  06/20/2015 141 134 - 144 mmol/L Final         Failed - Cr in normal range and within 180 days    Creat  Date Value Ref Range Status  12/03/2021 1.23 0.70 - 1.35 mg/dL Final   Creatinine, Urine  Date Value Ref Range Status  12/03/2021 57 20 - 320 mg/dL Final         Failed - eGFR is 10 or above and within 180 days    GFR calc Af Amer  Date Value Ref Range Status  08/18/2018 >60 >60 mL/min Final   GFR calc non Af Amer  Date Value Ref Range Status  08/18/2018 >60 >60 mL/min Final   eGFR  Date Value Ref Range Status  12/03/2021 67 > OR = 60 mL/min/1.36m Final         Passed - Patient is not pregnant      Passed - Last BP in normal range    BP Readings from Last 1 Encounters:  05/08/22 118/76         Passed - Valid encounter within last 6 months    [facility]           [facility]   2 months ago [facility]pert[facility], unspecified type   [facility]   5 months ago Hypertension, unspecified type   [facility]   6 months ago Hypertension, unspecified type   [facility]   6 months ago Hypertension, unspecified type   [facility]       Future  Appointments             In 1 month PReece Packer [facility]

## 2022-07-26 NOTE — Progress Notes (Unsigned)
There were no vitals taken for this visit.   Subjective:    Patient ID: Douglas Friedman, male    DOB: 1960-06-30, 62 y.o.   MRN: [id number]  HPI: Douglas Friedman is a 62 y.o. male  No chief complaint on file.  Hypertension: his blood pressure today is ***.  He is currently on cardizem 240 mg daily, bystolic 10 mg daily and valsartan-hydrochlorothiazide 160-12.5 mg daily. He last saw Dr. Juliann Pares cardiology on 06/24/2022. No changes.   History of TIA/hyperlipidemia:  last LDL was 80 on 08/20/2021. Currently taking asa 81 mg daily and atorvastatin 40 mg daily. Last saw neurology on 01/07/2022. No changes  basal cell carcinoma:last saw dermatology on 02/14/2022. Patient reports ***  Obesity: Patient is working on lifestyle modification. Patient current weight is *** with a BMI of ***  Anxiety:Patient has been taking lexapro 10 mg daily.  Patient reports ***.      03/25/2022    9:17 AM 12/31/2021   10:30 AM 12/03/2021    3:37 PM 08/29/2021    2:46 PM  GAD 7 : Generalized Anxiety Score  Nervous, Anxious, on Edge 0 0 3 3  Control/stop worrying 0 0 3 3  Worry too much - different things 0 0 3 3  Trouble relaxing 0 0 2 3  Restless 0 0 2 0  Easily annoyed or irritable 0 0 2 3  Afraid - awful might happen 0 0 2 0  Total GAD 7 Score 0 0 17 15  Anxiety Difficulty Not difficult at all Not difficult at all  Not difficult at all          05/08/2022   11:07 AM 03/25/2022    9:16 AM 12/31/2021   10:29 AM 12/31/2021   10:10 AM 12/03/2021    3:37 PM  Depression screen PHQ 2/9  Decreased Interest 0 0 0 0 0  Down, Depressed, Hopeless 0 0 0 0 0  PHQ - 2 Score 0 0 0 0 0  Altered sleeping  0 0  0  Tired, decreased energy  0 1  1  Change in appetite  0 0  0  Feeling bad or failure about yourself   0 0  1  Trouble concentrating  0 0  0  Moving slowly or fidgety/restless  0 0  2  Suicidal thoughts  0 0  0  PHQ-9 Score  0 1  4  Difficult doing work/chores  Not difficult at all Not difficult at  all  Not difficult at all     Relevant past medical, surgical, family and social history reviewed and updated as indicated. Interim medical history since our last visit reviewed. Allergies and medications reviewed and updated.  Review of Systems  Constitutional: Negative for fever or weight change.  Respiratory: Negative for cough and shortness of breath.   Cardiovascular: Negative for chest pain or palpitations.  Gastrointestinal: Negative for abdominal pain, no bowel changes.  Musculoskeletal: Negative for gait problem or joint swelling.  Skin: Negative for rash.  Neurological: Negative for dizziness, positive for headache.  No other specific complaints in a complete review of systems (except as listed in HPI above).      Objective:    There were no vitals taken for this visit.  Wt Readings from Last 3 Encounters:  05/08/22 226 lb 11.2 oz (102.8 kg)  03/25/22 226 lb 6.4 oz (102.7 kg)  12/31/21 223 lb 14.4 oz (101.6 kg)    Physical Exam Constitutional: Patient appears  well-developed and well-nourished. Obese  No distress.  HEENT: head atraumatic, normocephalic, pupils equal and reactive to light, neck supple Cardiovascular: Normal rate, regular rhythm and normal heart sounds.  No murmur heard. No BLE edema. Pulmonary/Chest: Effort normal and breath sounds normal. No respiratory distress. Abdominal: Soft.  There is no tenderness. Psychiatric: Patient has a normal mood and affect. behavior is normal. Judgment and thought content normal.   Results for orders placed or performed in visit on 12/05/21  Calcium, ionized  Result Value Ref Range   Calcium, Ion 5.5 4.7 - 5.5 mg/dL  Parathyroid hormone, intact (no Ca)  Result Value Ref Range   PTH 57 16 - 77 pg/mL      Assessment & Plan:   Problem List Items Addressed This Visit   None    Follow up plan: No follow-ups on file.

## 2022-07-29 ENCOUNTER — Encounter: Payer: Self-pay | Admitting: Nurse Practitioner

## 2022-07-29 ENCOUNTER — Ambulatory Visit (INDEPENDENT_AMBULATORY_CARE_PROVIDER_SITE_OTHER): Payer: 59 | Admitting: Nurse Practitioner

## 2022-07-29 VITALS — BP 136/74 | HR 66 | Resp 16 | Ht 69.0 in | Wt 230.0 lb

## 2022-07-29 DIAGNOSIS — E782 Mixed hyperlipidemia: Secondary | ICD-10-CM | POA: Diagnosis not present

## 2022-07-29 DIAGNOSIS — C434 Malignant melanoma of scalp and neck: Secondary | ICD-10-CM

## 2022-07-29 DIAGNOSIS — Z8673 Personal history of transient ischemic attack (TIA), and cerebral infarction without residual deficits: Secondary | ICD-10-CM

## 2022-07-29 DIAGNOSIS — I10 Essential (primary) hypertension: Secondary | ICD-10-CM | POA: Diagnosis not present

## 2022-07-29 DIAGNOSIS — C4441 Basal cell carcinoma of skin of scalp and neck: Secondary | ICD-10-CM

## 2022-07-29 DIAGNOSIS — F419 Anxiety disorder, unspecified: Secondary | ICD-10-CM

## 2022-07-29 DIAGNOSIS — Z131 Encounter for screening for diabetes mellitus: Secondary | ICD-10-CM

## 2022-07-29 DIAGNOSIS — E669 Obesity, unspecified: Secondary | ICD-10-CM | POA: Diagnosis not present

## 2022-07-29 LAB — CBC WITH DIFFERENTIAL/PLATELET
Absolute Monocytes: 585 cells/uL (ref 200–950)
Eosinophils Absolute: 354 cells/uL (ref 15–500)
Eosinophils Relative: 4.6 %
Neutrophils Relative %: 66.9 %
RBC: 5.24 10*6/uL (ref 4.20–5.80)

## 2022-07-29 NOTE — Assessment & Plan Note (Signed)
Working on staying physically active and eating healthy.

## 2022-07-29 NOTE — Assessment & Plan Note (Signed)
Continues to be seen by dermatology.  He reports he has a few more spots to be removed.

## 2022-07-29 NOTE — Assessment & Plan Note (Signed)
Doing well. Continue taking lexapro 10 mg daily.

## 2022-07-29 NOTE — Assessment & Plan Note (Signed)
Doing well, continue working on lifestyle modification. He is currently taking cardizem 240 mg daily, bystolic 10 mg daily and valsartan-hydrochlorothiazide 160-12.5 mg daily

## 2022-07-29 NOTE — Assessment & Plan Note (Signed)
Continue taking asa 81 mg daily and atorvastatin 40 mg daily

## 2022-07-29 NOTE — Assessment & Plan Note (Signed)
Continue taking atorvastatin 40 mg daily. 

## 2022-07-30 LAB — CBC WITH DIFFERENTIAL/PLATELET
Basophils Absolute: 54 cells/uL (ref 0–200)
Basophils Relative: 0.7 %
HCT: 47.9 % (ref 38.5–50.0)
Hemoglobin: 16.4 g/dL (ref 13.2–17.1)
Lymphs Abs: 1555 cells/uL (ref 850–3900)
MCH: 31.3 pg (ref 27.0–33.0)
MCHC: 34.2 g/dL (ref 32.0–36.0)
MCV: 91.4 fL (ref 80.0–100.0)
MPV: 11.3 fL (ref 7.5–12.5)
Monocytes Relative: 7.6 %
Neutro Abs: 5151 cells/uL (ref 1500–7800)
Platelets: 254 10*3/uL (ref 140–400)
RDW: 12.8 % (ref 11.0–15.0)
Total Lymphocyte: 20.2 %
WBC: 7.7 10*3/uL (ref 3.8–10.8)

## 2022-07-30 LAB — COMPLETE METABOLIC PANEL WITH GFR
AG Ratio: 1.6 (calc) (ref 1.0–2.5)
ALT: 17 U/L (ref 9–46)
AST: 16 U/L (ref 10–35)
Albumin: 4.4 g/dL (ref 3.6–5.1)
Alkaline phosphatase (APISO): 53 U/L (ref 35–144)
BUN: 14 mg/dL (ref 7–25)
CO2: 31 mmol/L (ref 20–32)
Calcium: 10.9 mg/dL — ABNORMAL HIGH (ref 8.6–10.3)
Chloride: 105 mmol/L (ref 98–110)
Creat: 1.18 mg/dL (ref 0.70–1.35)
Globulin: 2.8 g/dL (calc) (ref 1.9–3.7)
Glucose, Bld: 105 mg/dL — ABNORMAL HIGH (ref 65–99)
Potassium: 4.6 mmol/L (ref 3.5–5.3)
Sodium: 142 mmol/L (ref 135–146)
Total Bilirubin: 0.4 mg/dL (ref 0.2–1.2)
Total Protein: 7.2 g/dL (ref 6.1–8.1)
eGFR: 70 mL/min/{1.73_m2} (ref >=60)

## 2022-07-30 LAB — HEMOGLOBIN A1C
Hgb A1c MFr Bld: 5.6 % of total Hgb (ref <5.7)
Mean Plasma Glucose: 114 mg/dL
eAG (mmol/L): 6.3 mmol/L

## 2022-07-30 LAB — LIPID PANEL
Cholesterol: 204 mg/dL — ABNORMAL HIGH (ref <200)
HDL: 42 mg/dL (ref >=40)
LDL Cholesterol (Calc): 135 mg/dL (calc) — ABNORMAL HIGH
Non-HDL Cholesterol (Calc): 162 mg/dL (calc) — ABNORMAL HIGH (ref <130)
Total CHOL/HDL Ratio: 4.9 (calc) (ref <5.0)
Triglycerides: 142 mg/dL (ref <150)

## 2022-09-13 ENCOUNTER — Other Ambulatory Visit: Payer: Self-pay | Admitting: Nurse Practitioner

## 2022-09-13 NOTE — Telephone Encounter (Signed)
Requested Prescriptions  Pending Prescriptions Disp Refills   nebivolol (BYSTOLIC) 5 MG tablet [Pharmacy Med Name: NEBIVOLOL 5 MG TABLET] 180 tablet 1    Sig: TAKE 2 TABLETS BY MOUTH DAILY     Cardiovascular: Beta Blockers 3 Passed - 09/13/2022  6:23 AM      Passed - Cr in normal range and within 360 days    Creat  Date Value Ref Range Status  07/29/2022 1.18 0.70 - 1.35 mg/dL Final   Creatinine, Urine  Date Value Ref Range Status  12/03/2021 57 20 - 320 mg/dL Final         Passed - AST in normal range and within 360 days    AST  Date Value Ref Range Status  07/29/2022 16 10 - 35 U/L Final         Passed - ALT in normal range and within 360 days    ALT  Date Value Ref Range Status  07/29/2022 17 9 - 46 U/L Final         Passed - Last BP in normal range    BP Readings from Last 1 Encounters:  07/29/22 136/74         Passed - Last Heart Rate in normal range    Pulse Readings from Last 1 Encounters:  07/29/22 66         Passed - Valid encounter within last 6 months    Recent Outpatient Visits           1 month ago Hypertension, unspecified type   [facility] Berniece Salines, FNP   4 months ago Vertigo   [facility] Berniece Salines, FNP   5 months ago Hypertension, unspecified type   [facility] Berniece Salines, FNP   8 months ago Hypertension, unspecified type   [facility] Berniece Salines, FNP   9 months ago Hypertension, unspecified type   [facility] Berniece Salines, FNP       Future Appointments             In 4 months Zane Herald, Rudolpho Sevin, FNP [facility], [facility]

## 2022-09-18 ENCOUNTER — Other Ambulatory Visit: Payer: Self-pay | Admitting: Nurse Practitioner

## 2022-09-18 DIAGNOSIS — F419 Anxiety disorder, unspecified: Secondary | ICD-10-CM

## 2022-09-18 DIAGNOSIS — I10 Essential (primary) hypertension: Secondary | ICD-10-CM

## 2022-09-19 NOTE — Telephone Encounter (Signed)
Requested Prescriptions  Pending Prescriptions Disp Refills   valsartan-hydrochlorothiazide (DIOVAN-HCT) 160-12.5 MG tablet [Pharmacy Med Name: VALSARTAN-HCTZ 160-12.5 MG TAB] 90 tablet 1    Sig: TAKE 1 TABLET BY MOUTH DAILY     Cardiovascular: ARB + Diuretic Combos Passed - 09/18/2022  6:23 AM      Passed - K in normal range and within 180 days    Potassium  Date Value Ref Range Status  07/29/2022 4.6 3.5 - 5.3 mmol/L Final         Passed - Na in normal range and within 180 days    Sodium  Date Value Ref Range Status  07/29/2022 142 135 - 146 mmol/L Final  06/20/2015 141 134 - 144 mmol/L Final         Passed - Cr in normal range and within 180 days    Creat  Date Value Ref Range Status  07/29/2022 1.18 0.70 - 1.35 mg/dL Final   Creatinine, Urine  Date Value Ref Range Status  12/03/2021 57 20 - 320 mg/dL Final         Passed - eGFR is 10 or above and within 180 days    GFR calc Af Amer  Date Value Ref Range Status  08/18/2018 >60 >60 mL/min Final   GFR calc non Af Amer  Date Value Ref Range Status  08/18/2018 >60 >60 mL/min Final   eGFR  Date Value Ref Range Status  07/29/2022 70 > OR = 60 mL/min/1.89m2 Final         Passed - Patient is not pregnant      Passed - Last BP in normal range    BP Readings from Last 1 Encounters:  07/29/22 136/74         Passed - Valid encounter within last 6 months    Recent Outpatient Visits           1 month ago Hypertension, unspecified type   [facility] Berniece Salines, FNP   4 months ago Vertigo   [facility] Berniece Salines, FNP   5 months ago Hypertension, unspecified type   [facility] Berniece Salines, FNP   8 months ago Hypertension, unspecified type   [facility] Berniece Salines, FNP   9 months ago Hypertension, unspecified type   [facility] Berniece Salines, FNP       Future  Appointments             In 4 months Zane Herald, Rudolpho Sevin, FNP [facility], [facility]             escitalopram (LEXAPRO) 10 MG tablet [Pharmacy Med Name: ESCITALOPRAM 10 MG TABLET] 90 tablet 1    Sig: TAKE 1 TABLET BY MOUTH DAILY     Psychiatry:  Antidepressants - SSRI Passed - 09/18/2022  6:23 AM      Passed - Valid encounter within last 6 months    Recent Outpatient Visits           1 month ago Hypertension, unspecified type   [facility] Berniece Salines, FNP   4 months ago Vertigo   [facility] Berniece Salines, FNP   5 months ago Hypertension, unspecified type   [facility] Berniece Salines, FNP   8 months ago Hypertension, unspecified type   [facility],  Rudolpho Sevin, FNP   9 months ago Hypertension, unspecified type   [facility] Berniece Salines, FNP       Future Appointments             In 4 months Zane Herald, Rudolpho Sevin, FNP [facility], [facility]

## 2022-09-20 ENCOUNTER — Ambulatory Visit: Payer: 59

## 2022-12-10 ENCOUNTER — Other Ambulatory Visit: Payer: Self-pay | Admitting: Family Medicine

## 2022-12-10 DIAGNOSIS — E782 Mixed hyperlipidemia: Secondary | ICD-10-CM

## 2022-12-11 NOTE — Telephone Encounter (Signed)
Requested Prescriptions  Pending Prescriptions Disp Refills   atorvastatin (LIPITOR) 40 MG tablet [Pharmacy Med Name: ATORVASTATIN 40 MG TABLET] 90 tablet 0    Sig: TAKE 1 TABLET BY MOUTH DAILY     Cardiovascular:  Antilipid - Statins Failed - 12/10/2022  6:22 AM      Failed - Lipid Panel in normal range within the last 12 months    Cholesterol, Total  Date Value Ref Range Status  06/20/2015 203 (H) 100 - 199 mg/dL Final   Cholesterol  Date Value Ref Range Status  07/29/2022 204 (H) <200 mg/dL Final   LDL Cholesterol (Calc)  Date Value Ref Range Status  07/29/2022 135 (H) mg/dL (calc) Final    Comment:    Reference range: <100 . Desirable range <100 mg/dL for primary prevention;   <70 mg/dL for patients with CHD or diabetic patients  with > or = 2 CHD risk factors. Marland Kitchen LDL-C is now calculated using the Martin-Hopkins  calculation, which is a validated novel method providing  better accuracy than the Friedewald equation in the  estimation of LDL-C.  Horald Pollen et al. Lenox Ahr. 1610;960(45): 2061-2068  (http://education.[facility].com/faq/FAQ164)    HDL  Date Value Ref Range Status  07/29/2022 42 > OR = 40 mg/dL Final  40/98/1191 41 >47 mg/dL Final   Triglycerides  Date Value Ref Range Status  07/29/2022 142 <150 mg/dL Final         Passed - Patient is not pregnant      Passed - Valid encounter within last 12 months    Recent Outpatient Visits           4 months ago Hypertension, unspecified type   [facility] Berniece Salines, FNP   7 months ago Vertigo   [facility] Berniece [facility]ines, FNP   8 months ago Hypertension, unspecified type   [facility] Berniece Salines, FNP   11 months ago Hypertension, unspecified type   [facility] Berniece Salines, FNP   1 year ago Hypertension, unspecified type   [facility] Berniece Salines, FNP        Future Appointments             In 1 month Zane Herald, Rudolpho Sevin, FNP [facility], [facility]

## 2023-01-28 ENCOUNTER — Other Ambulatory Visit: Payer: Self-pay

## 2023-01-28 ENCOUNTER — Ambulatory Visit (INDEPENDENT_AMBULATORY_CARE_PROVIDER_SITE_OTHER): Payer: 59 | Admitting: Nurse Practitioner

## 2023-01-28 ENCOUNTER — Encounter: Payer: Self-pay | Admitting: Nurse Practitioner

## 2023-01-28 VITALS — BP 110/78 | HR 87 | Temp 97.9°F | Resp 16 | Ht 69.0 in | Wt 225.7 lb

## 2023-01-28 DIAGNOSIS — Z8673 Personal history of transient ischemic attack (TIA), and cerebral infarction without residual deficits: Secondary | ICD-10-CM

## 2023-01-28 DIAGNOSIS — I10 Essential (primary) hypertension: Secondary | ICD-10-CM

## 2023-01-28 DIAGNOSIS — E782 Mixed hyperlipidemia: Secondary | ICD-10-CM

## 2023-01-28 DIAGNOSIS — E669 Obesity, unspecified: Secondary | ICD-10-CM

## 2023-01-28 DIAGNOSIS — F419 Anxiety disorder, unspecified: Secondary | ICD-10-CM

## 2023-01-28 NOTE — Progress Notes (Signed)
BP 110/78   Pulse 87   Temp 97.9 F (36.6 C) (Oral)   Resp 16   Ht 5\' 9"  (1.753 m)   Wt 225 lb 11.2 oz (102.4 kg)   SpO2 96%   BMI 33.33 kg/m    Subjective:    Patient ID: Douglas Friedman, male    DOB: 1960/11/18, 62 y.o.   MRN: [id number]  HPI: Douglas Friedman is a 62 y.o. male  Chief Complaint  Patient presents with   Medical Management of Chronic Issues   Hypertension:  -Medications: cardizem 240 mg daily, bystolic 10 mg daily and valsartan-hydrochlorothiazide 160-12.5 mg daily. -Patient is compliant with above medications and reports no side effects. -Checking BP at home (average): 120s -Denies any SOB, CP, vision changes, LE edema or symptoms of hypotension -Diet: recommend DASH diet  -Exercise: recommend 150 min of physical activity weekly       01/28/2023    8:36 AM 07/29/2022    9:52 AM 05/08/2022   10:53 AM  Vitals with BMI  Height 5\' 9"  5\' 9"  5' 9.25"  Weight 225 lbs 11 oz 230 lbs 226 lbs 11 oz  BMI 33.31 33.95 33.23  Systolic 110 136 409  Diastolic 78 74 76  Pulse 87 66 56    HLD/history of TIA:  -Medications: atorvastatin 40 mg daily, asa 81 mg daily -Patient is compliant with above medications and reports no side effects.  -Last lipid panel:  Lipid Panel     Component Value Date/Time   CHOL 204 (H) 07/29/2022 1010   CHOL 203 (H) 06/20/2015 1045   TRIG 142 07/29/2022 1010   HDL 42 07/29/2022 1010   HDL 41 06/20/2015 1045   CHOLHDL 4.9 07/29/2022 1010   VLDL 23 10/16/2015 1027   LDLCALC 135 (H) 07/29/2022 1010   LABVLDL 28 06/20/2015 1045   Obesity:  Current weight : 225 lbs BMI: 33.33 previous weight:230 lbs Treatment Tried: lifestyle modification Comorbidities: HTN, HLD, anxiety   anxiety Medication lexapro 10 mg daily Compliant yes Side effects no PHQ9 negative GAD negative     01/28/2023    8:46 AM 03/25/2022    9:17 AM 12/31/2021   10:30 AM 12/03/2021    3:37 PM  GAD 7 : Generalized Anxiety Score  Nervous, Anxious, on  Edge 0 0 0 3  Control/stop worrying 0 0 0 3  Worry too much - different things 0 0 0 3  Trouble relaxing 0 0 0 2  Restless 0 0 0 2  Easily annoyed or irritable 0 0 0 2  Afraid - awful might happen 0 0 0 2  Total GAD 7 Score 0 0 0 17  Anxiety Difficulty Not difficult at all Not difficult at all Not difficult at all           01/28/2023    8:46 AM 01/28/2023    8:39 AM 07/29/2022    9:52 AM 05/08/2022   11:07 AM 03/25/2022    9:16 AM  Depression screen PHQ 2/9  Decreased Interest 0 0 0 0 0  Down, Depressed, Hopeless 0 0 0 0 0  PHQ - 2 Score 0 0 0 0 0  Altered sleeping 0  0  0  Tired, decreased energy 0  0  0  Change in appetite 0  0  0  Feeling bad or failure about yourself  0  0  0  Trouble concentrating 0  0  0  Moving slowly or fidgety/restless 0  0  0  Suicidal thoughts 0  0  0  PHQ-9 Score 0  0  0  Difficult doing work/chores Not difficult at all    Not difficult at all     Relevant past medical, surgical, family and social history reviewed and updated as indicated. Interim medical history since our last visit reviewed. Allergies and medications reviewed and updated.  Review of Systems  Constitutional: Negative for fever or weight change.  Respiratory: Negative for cough and shortness of breath.   Cardiovascular: Negative for chest pain or palpitations.  Gastrointestinal: Negative for abdominal pain, no bowel changes.  Musculoskeletal: Negative for gait problem or joint swelling.  Skin: Negative for rash.  Neurological: Negative for dizziness, positive for headache.  No other specific complaints in a complete review of systems (except as listed in HPI above).      Objective:    BP 110/78   Pulse 87   Temp 97.9 F (36.6 C) (Oral)   Resp 16   Ht 5\' 9"  (1.753 m)   Wt 225 lb 11.2 oz (102.4 kg)   SpO2 96%   BMI 33.33 kg/m   Wt Readings from Last 3 Encounters:  01/28/23 225 lb 11.2 oz (102.4 kg)  07/29/22 230 lb (104.3 kg)  05/08/22 226 lb 11.2 oz (102.8 kg)     Physical Exam Constitutional: Patient appears well-developed and well-nourished. Obese  No distress.  HEENT: head atraumatic, normocephalic, pupils equal and reactive to light, neck supple Cardiovascular: Normal rate, regular rhythm and normal heart sounds.  No murmur heard. No BLE edema. Pulmonary/Chest: Effort normal and breath sounds normal. No respiratory distress. Abdominal: Soft.  There is no tenderness. Psychiatric: Patient has a normal mood and affect. behavior is normal. Judgment and thought content normal.   Results for orders placed or performed in visit on 07/29/22  CBC with Differential/Platelet  Result Value Ref Range   WBC 7.7 3.8 - 10.8 Thousand/uL   RBC 5.24 4.20 - 5.80 Million/uL   Hemoglobin 16.4 13.2 - 17.1 g/dL   HCT 16.1 09.6 - 04.5 %   MCV 91.4 80.0 - 100.0 fL   MCH 31.3 27.0 - 33.0 pg   MCHC 34.2 32.0 - 36.0 g/dL   RDW 40.9 81.1 - 91.4 %   Platelets 254 140 - 400 Thousand/uL   MPV 11.3 7.5 - 12.5 fL   Neutro Abs 5,151 1,500 - 7,800 cells/uL   Lymphs Abs 1,555 850 - 3,900 cells/uL   Absolute Monocytes 585 200 - 950 cells/uL   Eosinophils Absolute 354 15 - 500 cells/uL   Basophils Absolute 54 0 - 200 cells/uL   Neutrophils Relative % 66.9 %   Total Lymphocyte 20.2 %   Monocytes Relative 7.6 %   Eosinophils Relative 4.6 %   Basophils Relative 0.7 %  COMPLETE METABOLIC PANEL WITH GFR  Result Value Ref Range   Glucose, Bld 105 (H) 65 - 99 mg/dL   BUN 14 7 - 25 mg/dL   Creat 7.82 9.56 - 2.13 mg/dL   eGFR 70 > OR = 60 YQ/MVH/8.46N6   BUN/Creatinine Ratio SEE NOTE: 6 - 22 (calc)   Sodium 142 135 - 146 mmol/L   Potassium 4.6 3.5 - 5.3 mmol/L   Chloride 105 98 - 110 mmol/L   CO2 31 20 - 32 mmol/L   Calcium 10.9 (H) 8.6 - 10.3 mg/dL   Total Protein 7.2 6.1 - 8.1 g/dL   Albumin 4.4 3.6 - 5.1 g/dL   Globulin 2.8 1.9 - 3.7 g/dL (  calc)   AG Ratio 1.6 1.0 - 2.5 (calc)   Total Bilirubin 0.4 0.2 - 1.2 mg/dL   Alkaline phosphatase (APISO) 53 35 - 144 U/L    AST 16 10 - 35 U/L   ALT 17 9 - 46 U/L  Lipid panel  Result Value Ref Range   Cholesterol 204 (H) <200 mg/dL   HDL 42 > OR = 40 mg/dL   Triglycerides 469 <629 mg/dL   LDL Cholesterol (Calc) 135 (H) mg/dL (calc)   Total CHOL/HDL Ratio 4.9 <5.0 (calc)   Non-HDL Cholesterol (Calc) 162 (H) <130 mg/dL (calc)  Hemoglobin B2W  Result Value Ref Range   Hgb A1c MFr Bld 5.6 <5.7 % of total Hgb   Mean Plasma Glucose 114 mg/dL   eAG (mmol/L) 6.3 mmol/L      Assessment & Plan:   Problem List Items Addressed This Visit       Cardiovascular and Mediastinum   Hypertension - Primary    cardizem 240 mg daily, bystolic 10 mg daily and valsartan-hydrochlorothiazide 160-12.5 mg daily.      Relevant Orders   COMPLETE METABOLIC PANEL WITH GFR   CBC with Differential/Platelet     Other   Hyperlipidemia    Continue taking atorvastatin 40 mg daily, asa 81 mg daily      Relevant Orders   COMPLETE METABOLIC PANEL WITH GFR   Lipid panel   History of TIA (transient ischemic attack)    Continue taking atorvastatin 40 mg daily, asa 81 mg daily      Relevant Orders   COMPLETE METABOLIC PANEL WITH GFR   Lipid panel   CBC with Differential/Platelet   Obesity (BMI 30-39.9)    Continue working on lifestyle modification.      Anxiety    Continue Lexapro 10 mg daily.       Will get labs at [facility] in April  Follow up plan: Return in about 6 months (around 07/29/2023) for [facility].

## 2023-01-28 NOTE — Assessment & Plan Note (Signed)
Continue taking atorvastatin 40 mg daily, asa 81 mg daily

## 2023-01-28 NOTE — Assessment & Plan Note (Signed)
Continue working on lifestyle modification.  

## 2023-01-28 NOTE — Assessment & Plan Note (Signed)
cardizem 240 mg daily, bystolic 10 mg daily and valsartan-hydrochlorothiazide 160-12.5 mg daily.

## 2023-01-28 NOTE — Assessment & Plan Note (Signed)
Continue Lexapro 10 mg daily

## 2023-03-12 ENCOUNTER — Other Ambulatory Visit: Payer: Self-pay | Admitting: Nurse Practitioner

## 2023-03-12 DIAGNOSIS — F419 Anxiety disorder, unspecified: Secondary | ICD-10-CM

## 2023-03-12 DIAGNOSIS — E782 Mixed hyperlipidemia: Secondary | ICD-10-CM

## 2023-03-12 DIAGNOSIS — I10 Essential (primary) hypertension: Secondary | ICD-10-CM

## 2023-03-13 ENCOUNTER — Other Ambulatory Visit: Payer: Self-pay

## 2023-03-13 DIAGNOSIS — I10 Essential (primary) hypertension: Secondary | ICD-10-CM

## 2023-03-13 MED ORDER — VALSARTAN-HYDROCHLOROTHIAZIDE 160-12.5 MG PO TABS
1.0000 | ORAL_TABLET | Freq: Every day | ORAL | 1 refills | Status: DC
Start: 2023-03-13 — End: 2023-09-10

## 2023-03-14 MED ORDER — ATORVASTATIN CALCIUM 40 MG PO TABS
40.0000 mg | ORAL_TABLET | Freq: Every day | ORAL | 1 refills | Status: DC
Start: 2023-03-14 — End: 2023-09-10

## 2023-03-14 NOTE — Telephone Encounter (Signed)
Requested Prescriptions  Pending Prescriptions Disp Refills   escitalopram (LEXAPRO) 10 MG tablet [Pharmacy Med Name: ESCITALOPRAM 10 MG TABLET] 90 tablet 1    Sig: TAKE 1 TABLET BY MOUTH DAILY     Psychiatry:  Antidepressants - SSRI Passed - 03/12/2023 11:49 AM      Passed - Valid encounter within last 6 months    Recent Outpatient Visits           1 month ago Primary hypertension   [facility] Berniece Salines, FNP   7 months ago Hypertension, unspecified type   [facility] Berniece Salines, FNP   10 months ago Vertigo   [facility] Berniece Salines, FNP   11 months ago Hypertension, unspecified type   [facility] Berniece Salines, FNP   1 year ago Hypertension, unspecified type   [facility] Berniece Salines, FNP       Future Appointments             In 4 months Zane Herald, Rudolpho Sevin, FNP [facility], [facility]             valsartan-hydrochlorothiazide (DIOVAN-HCT) 160-12.5 MG tablet [Pharmacy Med Name: VALSARTAN-HCTZ 160-12.5 MG TAB] 90 tablet 1    Sig: TAKE 1 TABLET BY MOUTH DAILY     Cardiovascular: ARB + Diuretic Combos Failed - 03/12/2023 11:49 AM      Failed - K in normal range and within 180 days    Potassium  Date Value Ref Range Status  07/29/2022 4.6 3.5 - 5.3 mmol/L Final         Failed - Na in normal range and within 180 days    Sodium  Date Value Ref Range Status  07/29/2022 142 135 - 146 mmol/L Final  06/20/2015 141 134 - 144 mmol/L Final         Failed - Cr in normal range and within 180 days    Creat  Date Value Ref Range Status  07/29/2022 1.18 0.70 - 1.35 mg/dL Final   Creatinine, Urine  Date Value Ref Range Status  12/03/2021 57 20 - 320 mg/dL Final         Failed - eGFR is 10 or above and within 180 days    GFR calc Af Amer  Date Value Ref Range Status  08/18/2018 >60 >60 mL/min Final    GFR calc non Af Amer  Date Value Ref Range Status  08/18/2018 >60 >60 mL/min Final   eGFR  Date Value Ref Range Status  07/29/2022 70 > OR = 60 mL/min/1.24m2 Final         Passed - Patient is not pregnant      Passed - Last BP in normal range    BP Readings from Last 1 Encounters:  01/28/23 110/78         Passed - Valid encounter within last 6 months    Recent Outpatient Visits           1 month ago Primary hypertension   [facility] Berniece Salines, FNP   7 months ago Hypertension, unspecified type   [place] Berniece Salines, FNP   10 months ago Vertigo   [facility] Berniece Salines, FNP   11 months ago Hypertension, unspecified type   [facility] Berniece Salines, [place]  1 year ago Hypertension, unspecified type   [facility] Berniece Salines, FNP       Future Appointments             In 4 months Zane Herald, Rudolpho Sevin, FNP [facility], [facility]             nebivolol (BYSTOLIC) 5 MG tablet [Pharmacy Med Name: NEBIVOLOL 5 MG TABLET] 180 tablet 1    Sig: TAKE 2 TABLETS BY MOUTH DAILY     Cardiovascular: Beta Blockers 3 Passed - 03/12/2023 11:49 AM      Passed - Cr in normal range and within 360 days    Creat  Date Value Ref Range Status  07/29/2022 1.18 0.70 - 1.35 mg/dL Final   Creatinine, Urine  Date Value Ref Range Status  12/03/2021 57 20 - 320 mg/dL Final         Passed - AST in normal range and within 360 days    AST  Date Value Ref Range Status  07/29/2022 16 10 - 35 U/L Final         Passed - ALT in normal range and within 360 days    ALT  Date Value Ref Range Status  07/29/2022 17 9 - 46 U/L Final         Passed - Last BP in normal range    BP Readings from Last 1 Encounters:  01/28/23 110/78         Passed - Last Heart Rate in normal range    Pulse Readings from Last 1 Encounters:   01/28/23 87         Passed - Valid encounter within last 6 months    Recent Outpatient Visits           1 month ago Primary hypertension   [facility] Berniece Salines, FNP   7 months ago Hypertension, unspecified type   [facility] Berniece Salines, FNP   10 months ago Vertigo   [facility] Berniece Salines, FNP   11 months ago Hypertension, unspecified type   [facility] Berniece Salines, FNP   1 year ago Hypertension, unspecified type   [facility] Berniece Salines, FNP       Future Appointments             In 4 months Zane Herald, Rudolpho Sevin, FNP [facility], [facility]             atorvastatin (LIPITOR) 40 MG tablet 90 tablet 1    Sig: Take 1 tablet (40 mg total) by mouth daily.     Cardiovascular:  Antilipid - Statins Failed - 03/12/2023 11:49 AM      Failed - Lipid Panel in normal range within the last 12 months    Cholesterol, Total  Date Value Ref Range Status  06/20/2015 203 (H) 100 - 199 mg/dL Final   Cholesterol  Date Value Ref Range Status  07/29/2022 204 (H) <200 mg/dL Final   LDL Cholesterol (Calc)  Date Value Ref Range Status  07/29/2022 135 (H) mg/dL (calc) Final    Comment:    Reference range: <100 . Desirable range <100 mg/dL for primary prevention;   <70 mg/dL for patients with CHD or diabetic patients  with > or = 2 CHD risk factors. Marland Kitchen LDL-C is now calculated using the Martin-Hopkins  calculation,  which is a validated novel method providing  better accuracy than the Friedewald equation in the  estimation of LDL-C.  Horald Pollen et al. [employer] Ahr. 1610;960(45): 2061-2068  (http://education.[facility].com/faq/FAQ164)    HDL  Date Value Ref Range Status  07/29/2022 42 > OR = 40 mg/dL Final  40/98/1191 41 >47 mg/dL Final   Triglycerides  Date Value Ref Range Status  07/29/2022 142 <150 mg/dL  Final         Passed - Patient is not pregnant      Passed - Valid encounter within last 12 months    Recent Outpatient Visits           1 month ago Primary hypertension   [facility] Berniece Salines, FNP   7 months ago Hypertension, unspecified type   [facility] Berniece Salines, FNP   10 months ago Vertigo   [facility] Berniece Salines, FNP   11 months ago Hypertension, unspecified type   [facility] Berniece Salines, FNP   1 year ago Hypertension, unspecified type   [facility] Berniece Salines, FNP       Future Appointments             In 4 months Zane Herald, Rudolpho Sevin, FNP [facility], [facility]

## 2023-07-29 ENCOUNTER — Ambulatory Visit (INDEPENDENT_AMBULATORY_CARE_PROVIDER_SITE_OTHER): Payer: Self-pay | Admitting: Nurse Practitioner

## 2023-07-29 ENCOUNTER — Encounter: Payer: Self-pay | Admitting: Nurse Practitioner

## 2023-07-29 VITALS — BP 122/70 | HR 60 | Temp 97.9°F | Resp 18 | Ht 69.0 in | Wt 228.9 lb

## 2023-07-29 DIAGNOSIS — E782 Mixed hyperlipidemia: Secondary | ICD-10-CM | POA: Diagnosis not present

## 2023-07-29 DIAGNOSIS — I10 Essential (primary) hypertension: Secondary | ICD-10-CM

## 2023-07-29 DIAGNOSIS — Z0000 Encounter for general adult medical examination without abnormal findings: Secondary | ICD-10-CM

## 2023-07-29 DIAGNOSIS — Z8673 Personal history of transient ischemic attack (TIA), and cerebral infarction without residual deficits: Secondary | ICD-10-CM

## 2023-07-29 DIAGNOSIS — Z125 Encounter for screening for malignant neoplasm of prostate: Secondary | ICD-10-CM

## 2023-07-29 DIAGNOSIS — Z131 Encounter for screening for diabetes mellitus: Secondary | ICD-10-CM

## 2023-07-29 DIAGNOSIS — Z1159 Encounter for screening for other viral diseases: Secondary | ICD-10-CM

## 2023-07-29 NOTE — Progress Notes (Signed)
 Name: Douglas Friedman   MRN: [id number]    DOB: 10/13/1960   Date:07/29/2023       Progress Note  Subjective  Chief Complaint  Chief Complaint  Patient presents with   Annual Exam    HPI  Patient presents for annual CPE .  IPSS     Row Name 07/29/23 0805         International Prostate Symptom Score   How often have you had the sensation of not emptying your bladder? Not at All     How often have you had to urinate less than every two hours? Not at All     How often have you found you stopped and started again several times when you urinated? Not at All     How often have you found it difficult to postpone urination? Not at All     How often have you had a weak urinary stream? Not at All     How often have you had to strain to start urination? Not at All     How many times did you typically get up at night to urinate? 1 Time     Total IPSS Score 1       Quality of Life due to urinary symptoms   If you were to spend the rest of your life with your urinary condition just the way it is now how would you feel about that? Delighted               Diet: working on well balanced diet Exercise: walks 5 days a week, and does weight training 5 days a week Sleep: averages about 8 hrs each night Last dental exam: about 1 year ago Last eye exam: about 1 year, appointment scheduled on May 1st  Depression: phq 9 is negative    07/29/2023    8:17 AM 01/28/2023    8:46 AM 01/28/2023    8:39 AM 07/29/2022    9:52 AM 05/08/2022   11:07 AM  Depression screen PHQ 2/9  Decreased Interest 0 0 0 0 0  Down, Depressed, Hopeless 0 0 0 0 0  PHQ - 2 Score 0 0 0 0 0  Altered sleeping 0 0  0   Tired, decreased energy 0 0  0   Change in appetite 0 0  0   Feeling bad or failure about yourself  0 0  0   Trouble concentrating 0 0  0   Moving slowly or fidgety/restless 0 0  0   Suicidal thoughts 0 0  0   PHQ-9 Score 0 0  0   Difficult doing work/chores Not difficult at all Not difficult at all        Hypertension:  BP Readings from Last 3 Encounters:  07/29/23 122/70  01/28/23 110/78  07/29/22 136/74    Obesity: Wt Readings from Last 3 Encounters:  07/29/23 228 lb 14.4 oz (103.8 kg)  01/28/23 225 lb 11.2 oz (102.4 kg)  07/29/22 230 lb (104.3 kg)   BMI Readings from Last 3 Encounters:  07/29/23 33.80 kg/m  01/28/23 33.33 kg/m  07/29/22 33.97 kg/m     Lipids:  Lab Results  Component Value Date   CHOL 204 (H) 07/29/2022   CHOL 175 10/16/2015   CHOL 203 (H) 06/20/2015   Lab Results  Component Value Date   HDL 42 07/29/2022   HDL 44 10/16/2015   HDL 41 06/20/2015   Lab Results  Component Value Date  LDLCALC 135 (H) 07/29/2022   LDLCALC 108 10/16/2015   LDLCALC 134 (H) 06/20/2015   Lab Results  Component Value Date   TRIG 142 07/29/2022   TRIG 113 10/16/2015   TRIG 138 06/20/2015   Lab Results  Component Value Date   CHOLHDL 4.9 07/29/2022   CHOLHDL 4.0 10/16/2015   CHOLHDL 5.0 06/20/2015   No results found for: "LDLDIRECT" Glucose:  Glucose, Bld  Date Value Ref Range Status  07/29/2022 105 (H) 65 - 99 mg/dL Final    Comment:    .            Fasting reference interval . For someone without known diabetes, a glucose value between 100 and 125 mg/dL is consistent with prediabetes and should be confirmed with a follow-up test. .   12/03/2021 83 65 - 99 mg/dL Final    Comment:    .            Fasting reference interval .   08/18/2018 112 (H) 70 - 99 mg/dL Final    Flowsheet Row Office Visit from 01/28/2023 in [facility]  AUDIT-C Score 0        Married STD testing and prevention (HIV/chl/gon/syphilis): completed Hep C: not complete  Skin cancer: Discussed monitoring for atypical lesions Colorectal cancer: 10/01/2021, due in 2028 Prostate cancer: checking PSA today No results found for: "PSA"   Lung cancer:   Low Dose CT Chest recommended if Age 26-80 years, 30 pack-year currently smoking OR have  quit w/in 15years. Patient does not qualify.   AAA:  The USPSTF recommends one-time screening with ultrasonography in men ages 38 to 61 years who have ever smoked ECG:  2023  Vaccines:  HPV: up to at age 48 , ask insurance if age between 73-45  Shingrix: 56-64 yo and ask insurance if covered when patient above 60 yo Pneumonia: educated and discussed with patient. Flu: educated and discussed with patient.  Advanced Care Planning: A voluntary discussion about advance care planning including the explanation and discussion of advance directives.  Discussed health care proxy and Living will, and the patient was able to identify a health care proxy as Wife.  Patient does not have a living will at present time. If patient does have living will, I have requested they bring this to the clinic to be scanned in to their chart.  Patient Active Problem List   Diagnosis Date Noted   Basal cell carcinoma (BCC) of scalp 03/25/2022   Anxiety 12/03/2021   Obesity (BMI 30-39.9) 11/29/2021   History of TIA (transient ischemic attack) 08/29/2021   Hypertension 08/29/2021   Benign neoplasm of ascending colon    Hyperlipidemia 06/29/2015    Past Surgical History:  Procedure Laterality Date   CARPOMETACARPAL [facility]) FUSION OF THUMB Left 04/08/2019   Procedure: CARPOMETACARPAL [facility]) ARTHROPLASTY;  Surgeon: Molli Angelucci, MD;  Location: [facility] ORS;  Service: Orthopedics;  Laterality: Left;   CERVICAL FUSION  04/09/2003   x2   COLONOSCOPY WITH PROPOFOL  N/A 07/10/2015   Procedure: COLONOSCOPY WITH PROPOFOL ;  Surgeon: Marnee Sink, MD;  Location: [facility];  Service: Endoscopy;  Laterality: N/A;   COLONOSCOPY WITH PROPOFOL  N/A 10/01/2021   Procedure: COLONOSCOPY WITH PROPOFOL ;  Surgeon: Selena Daily, MD;  Location: [facility] ENDOSCOPY;  Service: Gastroenterology;  Laterality: N/A;   FINGER ARTHROSCOPY WITH CARPOMETACARPEL ([facility]) ARTHROPLASTY Right 02/04/2019   Procedure: FINGER ARTHROSCOPY WITH  CARPOMETACARPEL [facility]) ARTHROPLASTY;  Surgeon: Molli Angelucci, MD;  Location: [facility] ORS;  Service:  Orthopedics;  Laterality: Right;   LUMBAR MICRODISCECTOMY Bilateral 04/08/1998   L4 and L5   MELANOMA EXCISION     POLYPECTOMY  07/10/2015   Procedure: POLYPECTOMY;  Surgeon: Marnee Sink, MD;  Location: [facility];  Service: Endoscopy;;   SHOULDER SURGERY Right 04/08/2012   Torn Supraspinatus    Family History  Problem Relation Age of Onset   Diabetes Mother    Kidney disease Father    Colon cancer Father    Diabetes Brother     Social History   Socioeconomic History   Marital status: Married    Spouse name: Not on file   Number of children: Not on file   Years of education: Not on file   Highest education level: Not on file  Occupational History   Not on file  Tobacco Use   Smoking status: Former    Types: Cigarettes   Smokeless tobacco: Never   Tobacco comments:    quit 1988  Vaping Use   Vaping status: Never Used  Substance and Sexual Activity   Alcohol use: Yes    Alcohol/week: 6.0 standard drinks of alcohol    Types: 6 Cans of beer per week   Drug use: No   Sexual activity: Yes    Partners: Female  Other Topics Concern   Not on file  Social History Narrative   Not on file   Social Drivers of Health   Financial Resource Strain: Not on file  Food Insecurity: Not on file  Transportation Needs: Not on file  Physical Activity: Not on file  Stress: Not on file  Social Connections: Not on file  Intimate Partner Violence: Not on file     Current Outpatient Medications:    aspirin EC 81 MG tablet, Take 81 mg by mouth daily. Swallow whole., Disp: , Rfl:    atorvastatin  (LIPITOR) 40 MG tablet, Take 1 tablet (40 mg total) by mouth daily., Disp: 90 tablet, Rfl: 1   diltiazem (CARDIZEM CD) 240 MG 24 hr capsule, Take 240 mg by mouth daily., Disp: , Rfl:    escitalopram  (LEXAPRO ) 10 MG tablet, TAKE 1 TABLET BY MOUTH DAILY, Disp: 90 tablet, Rfl: 1   meclizine   (ANTIVERT ) 25 MG tablet, Take 1 tablet (25 mg total) by mouth 3 (three) times daily as needed for dizziness., Disp: 30 tablet, Rfl: 0   nebivolol  (BYSTOLIC ) 5 MG tablet, TAKE 2 TABLETS BY MOUTH DAILY, Disp: 180 tablet, Rfl: 1   valsartan -hydrochlorothiazide  (DIOVAN -HCT) 160-12.5 MG tablet, Take 1 tablet by mouth daily., Disp: 90 tablet, Rfl: 1  No Known Allergies   ROS Constitutional: Negative for fever or weight change.  Respiratory: Negative for cough and shortness of breath.   Cardiovascular: Negative for chest pain or palpitations.  Gastrointestinal: Negative for abdominal pain, no bowel changes.  Musculoskeletal: Negative for gait problem or joint swelling.  Skin: Negative for rash.  Neurological: Negative for dizziness or headache.  No other specific complaints in a complete review of systems (except as listed in HPI above).     Objective  Vitals:   07/29/23 0803  BP: 122/70  Pulse: 60  Resp: 18  Temp: 97.9 F (36.6 C)  SpO2: 98%  Weight: 228 lb 14.4 oz (103.8 kg)  Height: 5\' 9"  (1.753 m)    Body mass index is 33.8 kg/m.  Physical Exam Vitals reviewed.  Constitutional:      Appearance: Normal appearance.  HENT:     Head: Normocephalic.     Right Ear: Tympanic  membrane normal.     Left Ear: Tympanic membrane normal.     Nose: Nose normal.  Eyes:     Extraocular Movements: Extraocular movements intact.     Conjunctiva/sclera: Conjunctivae normal.     Pupils: Pupils are equal, round, and reactive to light.  Neck:     Thyroid: No thyroid mass, thyromegaly or thyroid tenderness.  Cardiovascular:     Rate and Rhythm: Normal rate and regular rhythm.     Pulses: Normal pulses.     Heart sounds: Normal heart sounds.  Pulmonary:     Effort: Pulmonary effort is normal.     Breath sounds: Normal breath sounds.  Abdominal:     General: Bowel sounds are normal.     Palpations: Abdomen is soft.  Musculoskeletal:        General: Normal range of motion.      Cervical back: Normal range of motion and neck supple.     Right lower leg: No edema.     Left lower leg: No edema.  Skin:    General: Skin is warm and dry.     Capillary Refill: Capillary refill takes less than 2 seconds.  Neurological:     General: No focal deficit present.     Mental Status: He is alert and oriented to person, place, and time. Mental status is at baseline.  Psychiatric:        Mood and Affect: Mood normal.        Behavior: Behavior normal.        Thought Content: Thought content normal.        Judgment: Judgment normal.     No results found for this or any previous visit (from the past 2160 hours).   Fall Risk:    07/29/2023    8:04 AM 01/28/2023    8:38 AM 07/29/2022    9:51 AM 05/08/2022   11:07 AM 03/25/2022    9:13 AM  Fall Risk   Falls in the past year? 0 0 0 0 0  Number falls in past yr: 0 0 0 0 0  Injury with Fall? 0 0 0 0 0  Risk for fall due to :  No Fall Risks No Fall Risks    Follow up Falls evaluation completed Falls prevention discussed Falls prevention discussed Falls evaluation completed Falls evaluation completed     Functional Status Survey: Is the patient deaf or have difficulty hearing?: No Does the patient have difficulty seeing, even when wearing glasses/contacts?: No Does the patient have difficulty concentrating, remembering, or making decisions?: No Does the patient have difficulty walking or climbing stairs?: No Does the patient have difficulty dressing or bathing?: No Does the patient have difficulty doing errands alone such as visiting a doctor's office or shopping?: No    Assessment & Plan   1. Annual physical exam (Primary)  - CBC with Differential/Platelet - Comprehensive metabolic panel with GFR - Lipid panel - Hemoglobin A1c - Hepatitis C antibody - PSA  2. Primary hypertension  - CBC with Differential/Platelet - Comprehensive metabolic panel with GFR  3. Mixed hyperlipidemia  - Lipid panel  4.  History of TIA (transient ischemic attack)  - Lipid panel  5. Encounter for hepatitis C screening test for low risk patient  - Hepatitis C antibody  6. Screening for prostate cancer  - PSA  7. Screening for diabetes mellitus  - Hemoglobin A1c   -Prostate cancer screening and PSA options (with potential risks and benefits of testing  vs not testing) were discussed along with recent recs/guidelines. -USPSTF grade A and B recommendations reviewed with patient; age-appropriate recommendations, preventive care, screening tests, etc discussed and encouraged; healthy living encouraged; see AVS for patient education given to patient -Discussed importance of 150 minutes of physical activity weekly, eat two servings of fish weekly, eat one serving of tree nuts ( cashews, pistachios, pecans, almonds.Aaron Aas) every other day, eat 6 servings of fruit/vegetables daily and drink plenty of water  and avoid sweet beverages.  -Reviewed Health Maintenance: yes

## 2023-07-30 ENCOUNTER — Encounter: Payer: Self-pay | Admitting: Nurse Practitioner

## 2023-07-30 LAB — CBC WITH DIFFERENTIAL/PLATELET
Absolute Lymphocytes: 1522 {cells}/uL (ref 850–3900)
Absolute Monocytes: 559 {cells}/uL (ref 200–950)
Basophils Absolute: 51 {cells}/uL (ref 0–200)
Basophils Relative: 0.9 %
Eosinophils Absolute: 359 {cells}/uL (ref 15–500)
Eosinophils Relative: 6.3 %
HCT: 42.2 % (ref 38.5–50.0)
Hemoglobin: 14.4 g/dL (ref 13.2–17.1)
MCH: 31.6 pg (ref 27.0–33.0)
MCHC: 34.1 g/dL (ref 32.0–36.0)
MCV: 92.5 fL (ref 80.0–100.0)
MPV: 11.5 fL (ref 7.5–12.5)
Monocytes Relative: 9.8 %
Neutro Abs: 3209 {cells}/uL (ref 1500–7800)
Neutrophils Relative %: 56.3 %
Platelets: 244 10*3/uL (ref 140–400)
RBC: 4.56 10*6/uL (ref 4.20–5.80)
RDW: 12.5 % (ref 11.0–15.0)
Total Lymphocyte: 26.7 %
WBC: 5.7 10*3/uL (ref 3.8–10.8)

## 2023-07-30 LAB — LIPID PANEL
Cholesterol: 218 mg/dL — ABNORMAL HIGH (ref <200)
HDL: 44 mg/dL (ref >=40)
LDL Cholesterol (Calc): 140 mg/dL — ABNORMAL HIGH
Non-HDL Cholesterol (Calc): 174 mg/dL — ABNORMAL HIGH (ref <130)
Total CHOL/HDL Ratio: 5 (calc) — ABNORMAL HIGH (ref <5.0)
Triglycerides: 205 mg/dL — ABNORMAL HIGH (ref <150)

## 2023-07-30 LAB — HEPATITIS C ANTIBODY: Hepatitis C Ab: NONREACTIVE

## 2023-07-30 LAB — COMPREHENSIVE METABOLIC PANEL WITH GFR
AG Ratio: 1.9 (calc) (ref 1.0–2.5)
ALT: 23 U/L (ref 9–46)
AST: 21 U/L (ref 10–35)
Albumin: 4.7 g/dL (ref 3.6–5.1)
Alkaline phosphatase (APISO): 59 U/L (ref 35–144)
BUN: 25 mg/dL (ref 7–25)
CO2: 27 mmol/L (ref 20–32)
Calcium: 10.7 mg/dL — ABNORMAL HIGH (ref 8.6–10.3)
Chloride: 106 mmol/L (ref 98–110)
Creat: 1.25 mg/dL (ref 0.70–1.35)
Globulin: 2.5 g/dL (ref 1.9–3.7)
Glucose, Bld: 105 mg/dL — ABNORMAL HIGH (ref 65–99)
Potassium: 5.1 mmol/L (ref 3.5–5.3)
Sodium: 140 mmol/L (ref 135–146)
Total Bilirubin: 0.3 mg/dL (ref 0.2–1.2)
Total Protein: 7.2 g/dL (ref 6.1–8.1)
eGFR: 65 mL/min/{1.73_m2} (ref >=60)

## 2023-07-30 LAB — HEMOGLOBIN A1C
Hgb A1c MFr Bld: 5.8 % — ABNORMAL HIGH (ref <5.7)
Mean Plasma Glucose: 120 mg/dL
eAG (mmol/L): 6.6 mmol/L

## 2023-07-30 LAB — PSA: PSA: 0.83 ng/mL (ref <=4.00)

## 2023-09-09 ENCOUNTER — Other Ambulatory Visit: Payer: Self-pay | Admitting: Nurse Practitioner

## 2023-09-09 DIAGNOSIS — F419 Anxiety disorder, unspecified: Secondary | ICD-10-CM

## 2023-09-09 DIAGNOSIS — I10 Essential (primary) hypertension: Secondary | ICD-10-CM

## 2023-09-09 DIAGNOSIS — E782 Mixed hyperlipidemia: Secondary | ICD-10-CM

## 2023-09-10 NOTE — Telephone Encounter (Signed)
 Requested Prescriptions  Pending Prescriptions Disp Refills   atorvastatin  (LIPITOR) 40 MG tablet [Pharmacy Med Name: ATORVASTATIN  40 MG TABLET] 90 tablet 1    Sig: TAKE 1 TABLET BY MOUTH DAILY     Cardiovascular:  Antilipid - Statins Failed - 09/10/2023 10:14 AM      Failed - Lipid Panel in normal range within the last 12 months    Cholesterol, Total  Date Value Ref Range Status  06/20/2015 203 (H) 100 - 199 mg/dL Final   Cholesterol  Date Value Ref Range Status  07/29/2023 218 (H) <200 mg/dL Final   LDL Cholesterol (Calc)  Date Value Ref Range Status  07/29/2023 140 (H) mg/dL (calc) Final    Comment:    Reference range: <100 . Desirable range <100 mg/dL for primary prevention;   <70 mg/dL for patients with CHD or diabetic patients  with > or = 2 CHD risk factors. Aaron Aas LDL-C is now calculated using the Martin-Hopkins  calculation, which is a validated novel method providing  better accuracy than the Friedewald equation in the  estimation of LDL-C.  Melinda Sprawls et al. Erroll Heard. 1610;960(45): 2061-2068  (http://education.[employer].com/faq/FAQ164)    HDL  Date Value Ref Range Status  07/29/2023 44 > OR = 40 mg/dL Final  40/98/1191 41 >47 mg/dL Final   Triglycerides  Date Value Ref Range Status  07/29/2023 205 (H) <150 mg/dL Final    Comment:    . If a non-fasting specimen was collected, consider repeat triglyceride testing on a fasting specimen if clinically indicated.  Imagene Mam et al. J. of Clin. Lipidol. 2015;9:129-169. Aaron Aas          Passed - Patient is not pregnant      Passed - Valid encounter within last 12 months    Recent Outpatient Visits           1 month ago Annual physical exam   [facility] Quinton Buckler, FNP       Future Appointments             In 10 months Abram Hoguet, Monalisa Angles, FNP [facility]             nebivolol  (BYSTOLIC ) 5 MG tablet [Pharmacy Med Name: NEBIVOLOL  5 MG TABLET]  180 tablet 1    Sig: TAKE 2 TABLETS BY MOUTH DAILY     Cardiovascular: Beta Blockers 3 Passed - 09/10/2023 10:14 AM      Passed - Cr in normal range and within 360 days    Creat  Date Value Ref Range Status  07/29/2023 1.25 0.70 - 1.35 mg/dL Final   Creatinine, Urine  Date Value Ref Range Status  12/03/2021 57 20 - 320 mg/dL Final         Passed - AST in normal range and within 360 days    AST  Date Value Ref Range Status  07/29/2023 21 10 - 35 U/L Final         Passed - ALT in normal range and within 360 days    ALT  Date Value Ref Range Status  07/29/2023 23 9 - 46 U/L Final         Passed - Last BP in normal range    BP Readings from Last 1 Encounters:  07/29/23 122/70         Passed - Last Heart Rate in normal range    Pulse Readings from Last 1 Encounters:  07/29/23 60  Passed - Valid encounter within last 6 months    Recent Outpatient Visits           1 month ago Annual physical exam   [facility] Quinton Buckler, FNP       Future Appointments             In 10 months Abram Hoguet, Monalisa Angles, FNP [facility]             valsartan -hydrochlorothiazide  (DIOVAN -HCT) 160-12.5 MG tablet [Pharmacy Med Name: VALSARTAN -HCTZ 160-12.5 MG TAB] 90 tablet 1    Sig: TAKE 1 TABLET BY MOUTH DAILY     Cardiovascular: ARB + Diuretic Combos Passed - 09/10/2023 10:14 AM      Passed - K in normal range and within 180 days    Potassium  Date Value Ref Range Status  07/29/2023 5.1 3.5 - 5.3 mmol/L Final         Passed - Na in normal range and within 180 days    Sodium  Date Value Ref Range Status  07/29/2023 140 135 - 146 mmol/L Final  06/20/2015 141 134 - 144 mmol/L Final         Passed - Cr in normal range and within 180 days    Creat  Date Value Ref Range Status  07/29/2023 1.25 0.70 - 1.35 mg/dL Final   Creatinine, Urine  Date Value Ref Range Status  12/03/2021 57 20 - 320 mg/dL Final          Passed - eGFR is 10 or above and within 180 days    GFR calc Af Amer  Date Value Ref Range Status  08/18/2018 >60 >60 mL/min Final   GFR calc non Af Amer  Date Value Ref Range Status  08/18/2018 >60 >60 mL/min Final   eGFR  Date Value Ref Range Status  07/29/2023 65 > OR = 60 mL/min/1.82m2 Final         Passed - Patient is not pregnant      Passed - Last BP in normal range    BP Readings from Last 1 Encounters:  07/29/23 122/70         Passed - Valid encounter within last 6 months    Recent Outpatient Visits           1 month ago Annual physical exam   [facility] Quinton Buckler, FNP       Future Appointments             In 10 months Quinton Buckler, FNP [facility]             escitalopram  (LEXAPRO ) 10 MG tablet [Pharmacy Med Name: ESCITALOPRAM  10 MG TABLET] 90 tablet 1    Sig: TAKE 1 TABLET BY MOUTH DAILY     Psychiatry:  Antidepressants - SSRI Passed - 09/10/2023 10:14 AM      Passed - Valid encounter within last 6 months    Recent Outpatient Visits           1 month ago Annual physical exam   [facility] Quinton Buckler, FNP       Future Appointments             In 10 months Abram Hoguet, Monalisa Angles, FNP [facility], [facility]

## 2023-09-11 IMAGING — MR MR HEAD W/O CM
12 series · 48 of 48 positions shown · non-contrast
Comparison: None Available.

CLINICAL DATA: Left-sided numbness and loss of vision in the left
eye. Symptoms resolved after 6 minutes.

EXAM:
MRI HEAD WITHOUT CONTRAST
TECHNIQUE: Multiplanar, multiecho pulse sequences of the brain and surrounding
structures were obtained without intravenous contrast.

[Series 9: ax dwi_tracew · axial · 3.0mm · 0.65mm/px · z∈[-138,+18]mm · 3 of 48 slices shown]
[im 1/48]
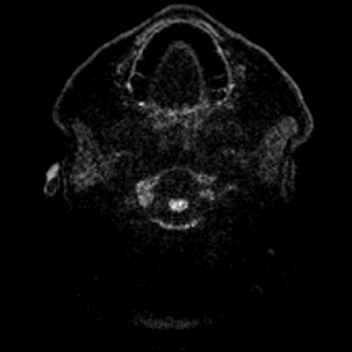
[im 24/48]
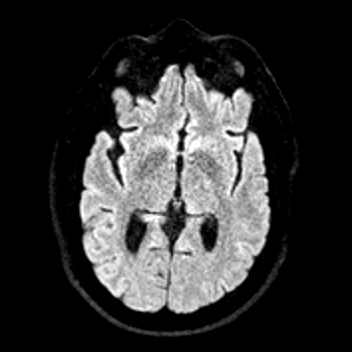
[im 48/48]
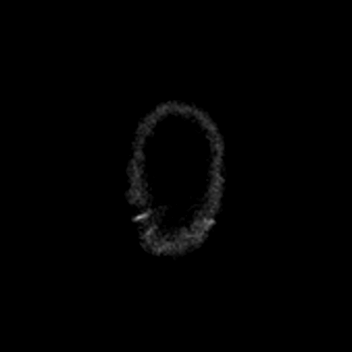

[Series 10: ax dwi_adc · axial · 3.0mm · 0.65mm/px · z∈[-138,+18]mm · 3 of 48 slices shown]
[im 1/48]
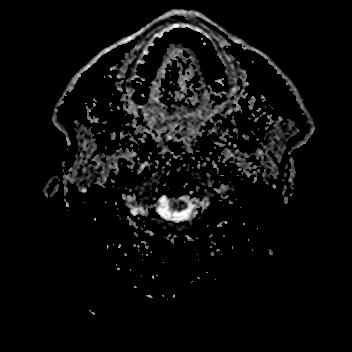
[im 24/48]
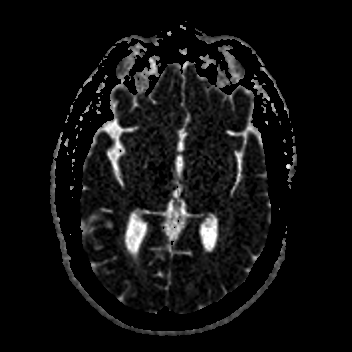
[im 48/48]
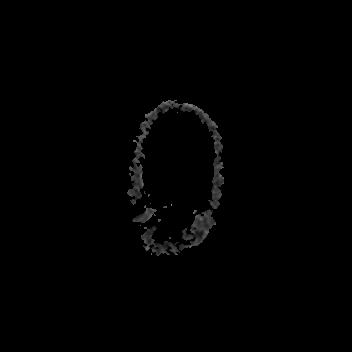

[Series 11: cor dwi_tracew · coronal · 5.0mm · 0.60mm/px · 3 of 38 slices shown]
[im 1/38]
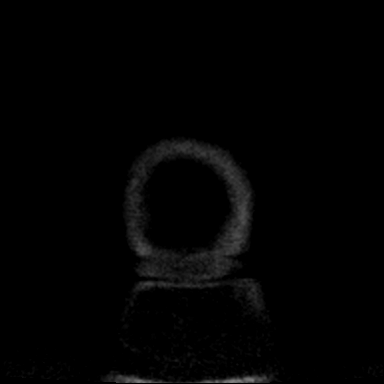
[im 19/38]
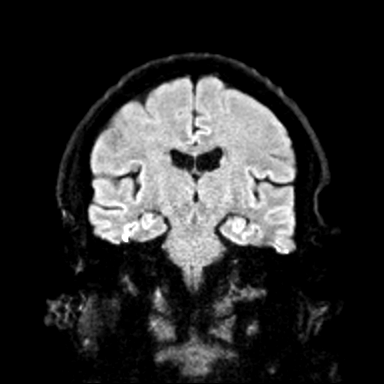
[im 38/38]
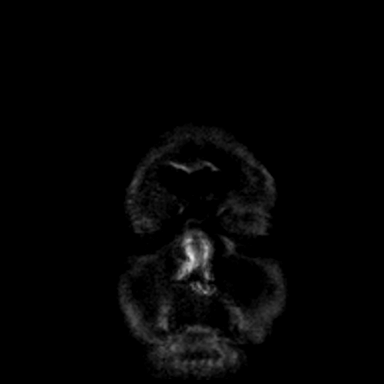

[Series 12: cor dwi_adc · coronal · 5.0mm · 0.60mm/px · 3 of 38 slices shown]
[im 1/38]
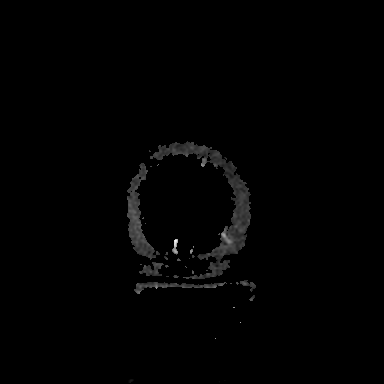
[im 19/38]
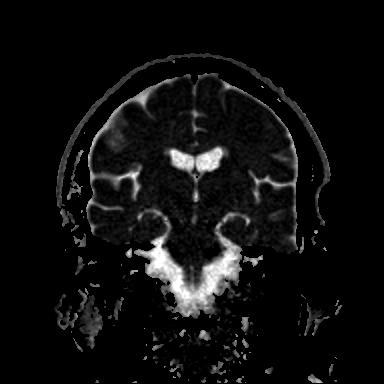
[im 38/38]
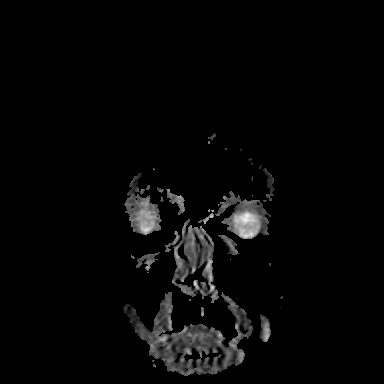

[Series 13: T1 · sagittal · 5.0mm · 0.62mm/px · 2 of 23 slices shown (1 of 2)]
[im 1/23]
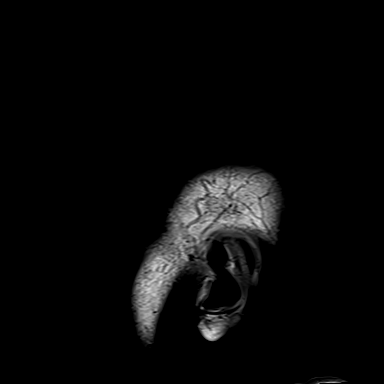
[im 23/23]
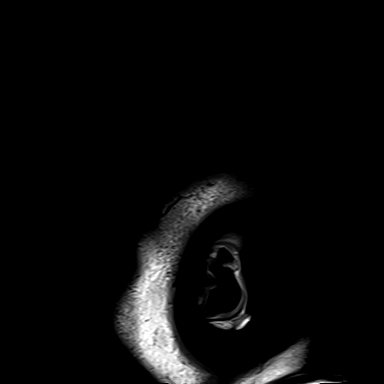

[Series 14: T2 · axial · 5.0mm · 0.53mm/px · z∈[-131,+13]mm · 2 of 25 slices shown (1 of 2)]
[im 1/25]
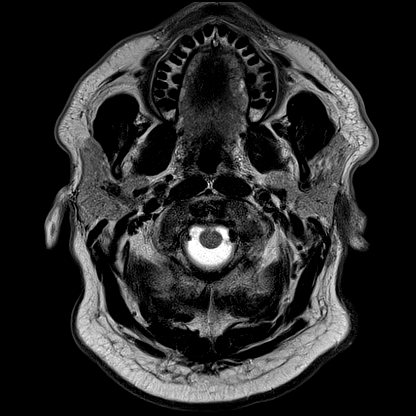
[im 25/25]
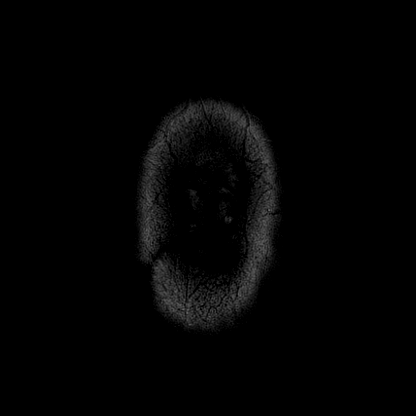

[Series 15: ax swi_mag · axial · 2.0mm · 0.90mm/px · z∈[-138,+20]mm · 5 of 80 slices shown]
[im 1/80]
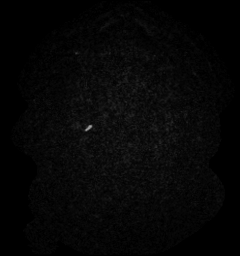
[im 20/80]
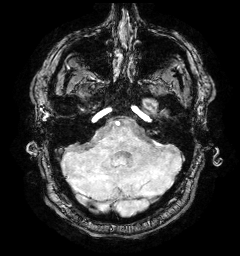
[im 40/80]
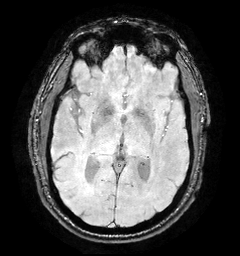
[im 60/80]
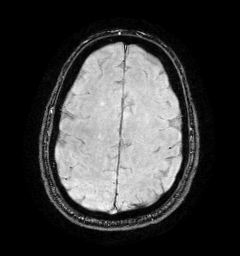
[im 80/80]
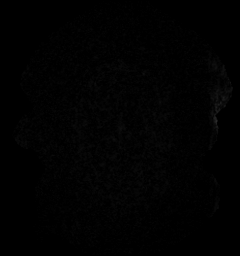

[Series 16: ax swi_pha · axial · 2.0mm · 0.90mm/px · z∈[-138,+20]mm · 5 of 80 slices shown]
[im 1/80]
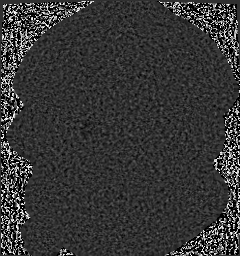
[im 20/80]
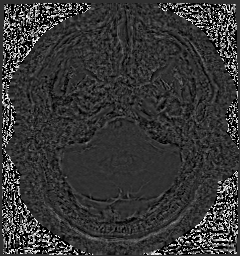
[im 40/80]
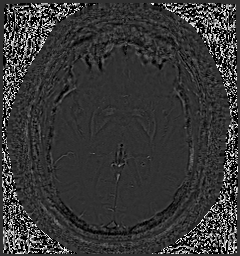
[im 60/80]
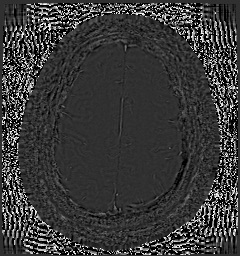
[im 80/80]
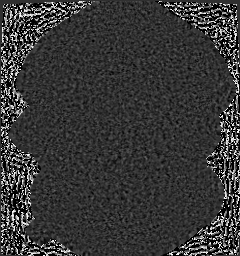

[Series 17: ax swi_swi · axial · 2.0mm · 0.90mm/px · z∈[-138,+20]mm · 5 of 80 slices shown]
[im 1/80]
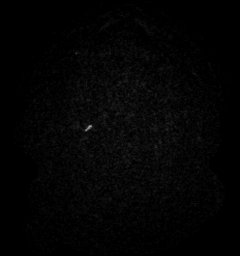
[im 20/80]
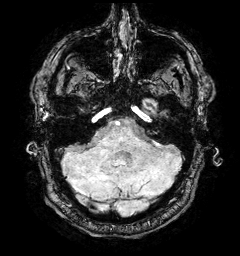
[im 40/80]
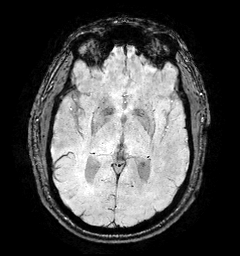
[im 60/80]
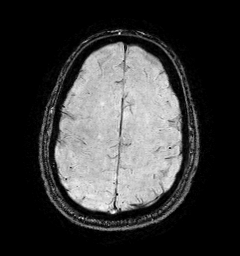
[im 80/80]
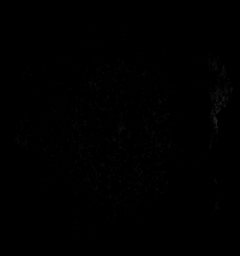

[Series 19: FLAIR · axial · 3.0mm · 0.53mm/px · z∈[-140,+22]mm · 4 of 55 slices shown]
[im 1/55]
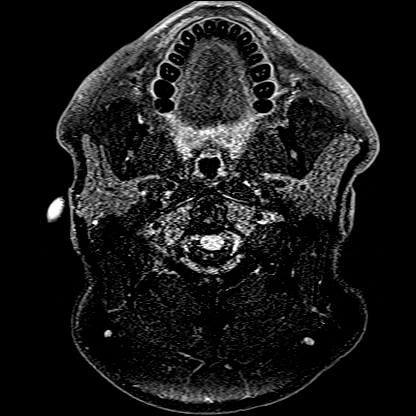
[im 19/55]
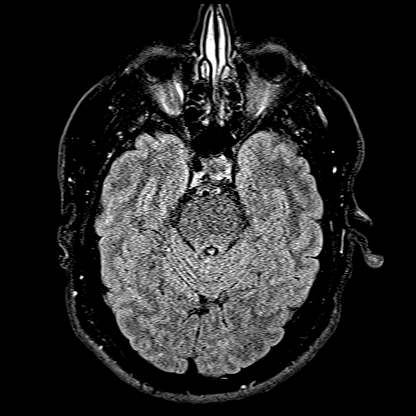
[im 37/55]
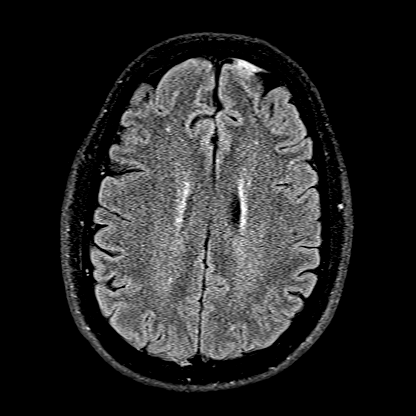
[im 55/55]
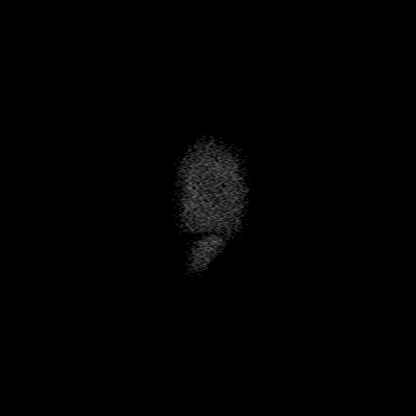

[Series 20: T1 · axial · 1.0mm · 0.98mm/px · z∈[-140,+19]mm · 11 of 160 slices shown (2 of 2)]
[im 1/160]
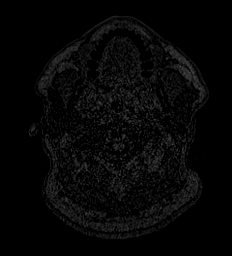
[im 16/160]
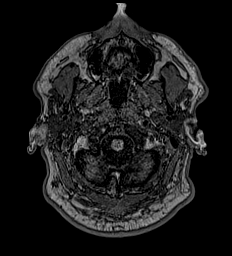
[im 32/160]
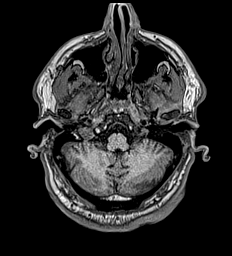
[im 48/160]
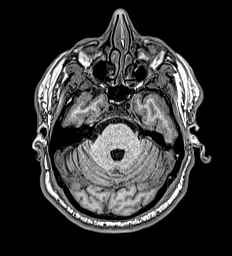
[im 64/160]
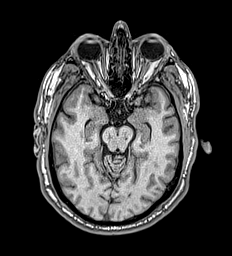
[im 80/160]
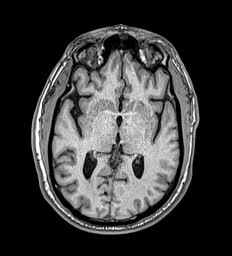
[im 96/160]
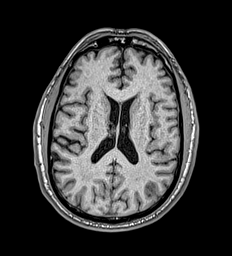
[im 112/160]
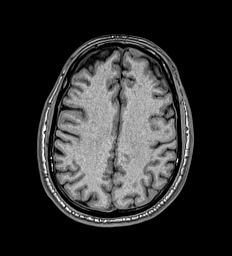
[im 128/160]
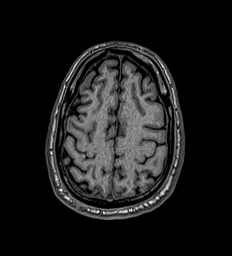
[im 144/160]
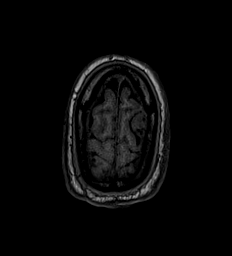
[im 160/160]
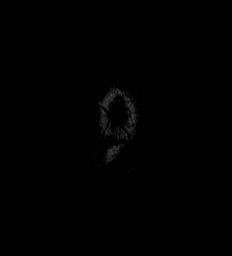

[Series 21: T2 · coronal · 5.0mm · 0.57mm/px · 2 of 29 slices shown (2 of 2)]
[im 1/29]
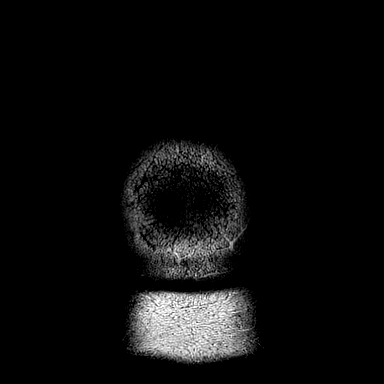
[im 29/29]
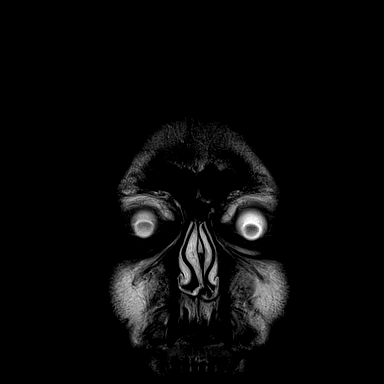

[48 of 48 positions shown; findings below may reference images not displayed]

FINDINGS: Brain: No acute infarction, hemorrhage, hydrocephalus, extra-axial
collection or mass effect. Extra-axial dural-based mass along the
anterior left frontal convexity measuring 13 x 8 mm, with isointense
T2 weighted signal and dural tail, most consistent with meningioma.
Mild chronic microvascular ischemia.

Vascular: Major flow voids are preserved

Skull and upper cervical spine: No focal marrow lesion. Upper and
posterior scalp scarring

Sinuses/Orbits: No evidence of orbital mass or inflammation.
IMPRESSION: 1. No acute or subacute insult.
2. 13 x 8 mm dural mass at the anterior left frontal convexity,
consistent with a meningioma.

## 2023-12-05 ENCOUNTER — Other Ambulatory Visit: Payer: Self-pay | Admitting: Nurse Practitioner

## 2023-12-05 DIAGNOSIS — F419 Anxiety disorder, unspecified: Secondary | ICD-10-CM

## 2023-12-05 DIAGNOSIS — I10 Essential (primary) hypertension: Secondary | ICD-10-CM

## 2023-12-05 DIAGNOSIS — E782 Mixed hyperlipidemia: Secondary | ICD-10-CM

## 2023-12-07 NOTE — Telephone Encounter (Signed)
 Requested Prescriptions  Pending Prescriptions Disp Refills   nebivolol  (BYSTOLIC ) 5 MG tablet [Pharmacy Med Name: NEBIVOLOL  5 MG TABLET] 180 tablet 2    Sig: TAKE 2 TABLETS BY MOUTH DAILY     Cardiovascular: Beta Blockers 3 Passed - 12/07/2023 10:40 AM      Passed - Cr in normal range and within 360 days    Creat  Date Value Ref Range Status  07/29/2023 1.25 0.70 - 1.35 mg/dL Final   Creatinine, Urine  Date Value Ref Range Status  12/03/2021 57 20 - 320 mg/dL Final         Passed - AST in normal range and within 360 days    AST  Date Value Ref Range Status  07/29/2023 21 10 - 35 U/L Final         Passed - ALT in normal range and within 360 days    ALT  Date Value Ref Range Status  07/29/2023 23 9 - 46 U/L Final         Passed - Last BP in normal range    BP Readings from Last 1 Encounters:  07/29/23 122/70         Passed - Last Heart Rate in normal range    Pulse Readings from Last 1 Encounters:  07/29/23 60         Passed - Valid encounter within last 6 months    Recent Outpatient Visits           4 months ago Annual physical exam   [facility] Gareth Mliss FALCON, FNP       Future Appointments             In 7 months Gareth, Mliss FALCON, FNP [facility], [place]             valsartan -hydrochlorothiazide  (DIOVAN -HCT) 160-12.5 MG tablet [Pharmacy Med Name: VALSARTAN -HCTZ 160-12.5 MG TAB] 90 tablet 2    Sig: TAKE 1 TABLET BY MOUTH DAILY     Cardiovascular: ARB + Diuretic Combos Passed - 12/07/2023 10:40 AM      Passed - K in normal range and within 180 days    Potassium  Date Value Ref Range Status  07/29/2023 5.1 3.5 - 5.3 mmol/L Final         Passed - Na in normal range and within 180 days    Sodium  Date Value Ref Range Status  07/29/2023 140 135 - 146 mmol/L Final  06/20/2015 141 134 - 144 mmol/L Final         Passed - Cr in normal range and within 180 days    Creat  Date Value Ref Range  Status  07/29/2023 1.25 0.70 - 1.35 mg/dL Final   Creatinine, Urine  Date Value Ref Range Status  12/03/2021 57 20 - 320 mg/dL Final         Passed - eGFR is 10 or above and within 180 days    GFR calc Af Amer  Date Value Ref Range Status  08/18/2018 >60 >60 mL/min Final   GFR calc non Af Amer  Date Value Ref Range Status  08/18/2018 >60 >60 mL/min Final   eGFR  Date Value Ref Range Status  07/29/2023 65 > OR = 60 mL/min/1.66m2 Final         Passed - Patient is not pregnant      Passed - Last BP in normal range    BP Readings from Last 1 Encounters:  07/29/23 122/70         Passed - Valid encounter within last 6 months    Recent Outpatient Visits           4 months ago Annual physical exam   [facility] Gareth Mliss FALCON, FNP       Future Appointments             In 7 months Gareth, Mliss FALCON, FNP [facility], [place]             atorvastatin  (LIPITOR) 40 MG tablet [Pharmacy Med Name: ATORVASTATIN  40 MG TABLET] 90 tablet 2    Sig: TAKE 1 TABLET BY MOUTH DAILY     Cardiovascular:  Antilipid - Statins Failed - 12/07/2023 10:40 AM      Failed - Lipid Panel in normal range within the last 12 months    Cholesterol, Total  Date Value Ref Range Status  06/20/2015 203 (H) 100 - 199 mg/dL Final   Cholesterol  Date Value Ref Range Status  07/29/2023 218 (H) <200 mg/dL Final   LDL Cholesterol (Calc)  Date Value Ref Range Status  07/29/2023 140 (H) mg/dL (calc) Final    Comment:    Reference range: <100 . Desirable range <100 mg/dL for primary prevention;   <70 mg/dL for patients with CHD or diabetic patients  with > or = 2 CHD risk factors. [facility] LDL-C is now calculated using the Martin-Hopkins  calculation, which is a validated novel method providing  better accuracy than the Friedewald equation in the  estimation of LDL-C.  Gladis APPLETHWAITE et al. SANDREA. 7986;689(80): 2061-2068   (http://education.[employer].com/faq/FAQ164)    HDL  Date Value Ref Range Status  07/29/2023 44 > OR = 40 mg/dL Final  96/85/7982 41 >60 mg/dL Final   Triglycerides  Date Value Ref Range Status  07/29/2023 205 (H) <150 mg/dL Final    Comment:    . If a non-fasting specimen was collected, consider repeat triglyceride testing on a fasting specimen if clinically indicated.  Veatrice et al. J. of Clin. Lipidol. 2015;9:129-169. [facility]ABRA          Passed - Patient is not pregnant      Passed - Valid encounter within last 12 months    Recent Outpatient Visits           4 months ago Annual physical exam   [facility] Gareth Mliss FALCON, FNP       Future Appointments             In 7 months Gareth, Mliss FALCON, FNP [facility], [place]             escitalopram  (LEXAPRO ) 10 MG tablet [Pharmacy Med Name: ESCITALOPRAM  10 MG TABLET] 90 tablet 2    Sig: TAKE 1 TABLET BY MOUTH DAILY     Psychiatry:  Antidepressants - SSRI Passed - 12/07/2023 10:40 AM      Passed - Valid encounter within last 6 months    Recent Outpatient Visits           4 months ago Annual physical exam   [facility] Gareth Mliss FALCON, FNP       Future Appointments             In 7 months Gareth, Mliss FALCON, FNP [facility] [place]

## 2023-12-29 ENCOUNTER — Other Ambulatory Visit: Payer: Self-pay | Admitting: Medical Genetics

## 2024-01-05 ENCOUNTER — Other Ambulatory Visit

## 2024-01-09 ENCOUNTER — Other Ambulatory Visit

## 2024-04-06 ENCOUNTER — Other Ambulatory Visit: Payer: Self-pay

## 2024-04-06 DIAGNOSIS — Z111 Encounter for screening for respiratory tuberculosis: Secondary | ICD-10-CM

## 2024-04-12 ENCOUNTER — Telehealth: Payer: Self-pay | Admitting: Nurse Practitioner

## 2024-04-12 NOTE — Telephone Encounter (Signed)
 Pt dropped of health exam form to be filled out. He also completed his tb testing today. Asking for a copy and for you to fax it to [phone] Attn: [facility] HR. Paperwork placed in julie yellow folder

## 2024-04-14 ENCOUNTER — Ambulatory Visit: Payer: Self-pay | Admitting: Nurse Practitioner

## 2024-04-14 LAB — QUANTIFERON-TB GOLD PLUS
Mitogen-NIL: 6.5 [IU]/mL
NIL: 0.04 [IU]/mL
QuantiFERON-TB Gold Plus: NEGATIVE
TB1-NIL: 0 [IU]/mL
TB2-NIL: <0 [IU]/mL

## 2024-04-19 ENCOUNTER — Telehealth: Payer: Self-pay | Admitting: Nurse Practitioner

## 2024-04-19 NOTE — Telephone Encounter (Unsigned)
 Copied from CRM #[id number]. Topic: General - Other >> Apr 19, 2024  3:38 PM Deleta RAMAN wrote: Reason for CRM: patient calling to see if form is ready for pick up. [phone]

## 2024-08-02 ENCOUNTER — Encounter: Admitting: Nurse Practitioner
